# Patient Record
Sex: Male | Born: 1990 | Race: Black or African American | Hispanic: No | Marital: Single | Smoking: Never smoker
Health system: Southern US, Community
[De-identification: ages and names within clinical notes are randomized; demographics above are authoritative.]

## PROBLEM LIST (undated history)

## (undated) ENCOUNTER — Emergency Department (HOSPITAL_COMMUNITY): Payer: Self-pay | Source: Home / Self Care

## (undated) DIAGNOSIS — A549 Gonococcal infection, unspecified: Secondary | ICD-10-CM

## (undated) DIAGNOSIS — W3400XA Accidental discharge from unspecified firearms or gun, initial encounter: Secondary | ICD-10-CM

---
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
## 2009-07-23 ENCOUNTER — Emergency Department (HOSPITAL_BASED_OUTPATIENT_CLINIC_OR_DEPARTMENT_OTHER): Admission: EM | Admit: 2009-07-23 | Discharge: 2009-07-23 | Payer: Self-pay | Admitting: Emergency Medicine

## 2009-08-11 ENCOUNTER — Emergency Department (HOSPITAL_BASED_OUTPATIENT_CLINIC_OR_DEPARTMENT_OTHER): Admission: EM | Admit: 2009-08-11 | Discharge: 2009-08-11 | Payer: Self-pay | Admitting: Rheumatology

## 2009-08-11 ENCOUNTER — Ambulatory Visit: Payer: Self-pay | Admitting: Diagnostic Radiology

## 2010-03-19 LAB — LIPASE, BLOOD: Lipase: 46 U/L (ref 23–300)

## 2010-03-19 LAB — CBC
HCT: 40.9 % (ref 39.0–52.0)
Hemoglobin: 13.7 g/dL (ref 13.0–17.0)
MCH: 30.7 pg (ref 26.0–34.0)
MCHC: 33.4 g/dL (ref 30.0–36.0)
RBC: 4.45 MIL/uL (ref 4.22–5.81)

## 2010-03-19 LAB — COMPREHENSIVE METABOLIC PANEL
ALT: 19 U/L (ref 0–53)
AST: 21 U/L (ref 0–37)
CO2: 29 mEq/L (ref 19–32)
Chloride: 104 mEq/L (ref 96–112)
Creatinine, Ser: 1.3 mg/dL (ref 0.4–1.5)
GFR calc Af Amer: >60 mL/min (ref >60)
GFR calc non Af Amer: >60 mL/min (ref >60)
Glucose, Bld: 82 mg/dL (ref 70–99)
Sodium: 145 mEq/L (ref 135–145)
Total Bilirubin: 1 mg/dL (ref 0.3–1.2)

## 2010-03-19 LAB — URINALYSIS, ROUTINE W REFLEX MICROSCOPIC
Ketones, ur: 15 mg/dL — AB
Nitrite: NEGATIVE
Protein, ur: 30 mg/dL — AB
pH: 6.5 (ref 5.0–8.0)

## 2010-03-19 LAB — DIFFERENTIAL
Basophils Absolute: 0 10*3/uL (ref 0.0–0.1)
Eosinophils Absolute: 0.2 10*3/uL (ref 0.0–0.7)
Eosinophils Relative: 6 % — ABNORMAL HIGH (ref 0–5)
Neutrophils Relative %: 49 % (ref 43–77)

## 2010-03-19 LAB — URINE MICROSCOPIC-ADD ON

## 2016-02-05 ENCOUNTER — Emergency Department (HOSPITAL_BASED_OUTPATIENT_CLINIC_OR_DEPARTMENT_OTHER)
Admission: EM | Admit: 2016-02-05 | Discharge: 2016-02-05 | Disposition: A | Discharge status: Home / Self Care | Payer: Self-pay | Attending: Emergency Medicine | Admitting: Emergency Medicine

## 2016-02-05 ENCOUNTER — Encounter (HOSPITAL_BASED_OUTPATIENT_CLINIC_OR_DEPARTMENT_OTHER): Payer: Self-pay | Admitting: *Deleted

## 2016-02-05 DIAGNOSIS — Z202 Contact with and (suspected) exposure to infections with a predominantly sexual mode of transmission: Secondary | ICD-10-CM | POA: Insufficient documentation

## 2016-02-05 DIAGNOSIS — F17200 Nicotine dependence, unspecified, uncomplicated: Secondary | ICD-10-CM | POA: Insufficient documentation

## 2016-02-05 MED ORDER — CEFTRIAXONE SODIUM 250 MG IJ SOLR
250.0000 mg | Freq: Once | INTRAMUSCULAR | Status: AC
  Administered 2016-02-05: 250 mg via INTRAMUSCULAR
  Filled 2016-02-05: qty 250

## 2016-02-05 MED ORDER — AZITHROMYCIN 250 MG PO TABS
1000.0000 mg | ORAL_TABLET | Freq: Once | ORAL | Status: AC
  Administered 2016-02-05: 1000 mg via ORAL
  Filled 2016-02-05: qty 4

## 2016-02-05 NOTE — ED Triage Notes (Signed)
Patient states he a two day history of penile drainage.  States his girlfriend was recently treated for gonorrhea.

## 2016-02-05 NOTE — ED Provider Notes (Signed)
[facility]   CSN: [id number] Arrival date & time: 02/05/16  1010     History   Chief Complaint Chief Complaint  Patient presents with  . Penile Discharge    HPI Nathan West is a 26 y.o. male.  Patient is a 26 year old male with no significant past medical history. He presents for evaluation of urethral discharge. He reports his fiance was diagnosed with gonorrhea. He denies any fevers or chills. He denies abdominal pain.   The history is provided by the patient.  Penile Discharge  This is a new problem. The current episode started 2 days ago. The problem occurs constantly. The problem has been rapidly worsening. Exacerbated by: Urinating. Nothing relieves the symptoms. He has tried nothing for the symptoms.    History reviewed. No pertinent past medical history.  There are no active problems to display for this patient.   History reviewed. No pertinent surgical history.     Home Medications    Prior to Admission medications   Not on File    Family History No family history on file.  Social History Social History  Substance Use Topics  . Smoking status: Current Some Day Smoker  . Smokeless tobacco: Never Used  . Alcohol use Yes     Comment: occassionally     Allergies   Patient has no known allergies.   Review of Systems Review of Systems  Genitourinary: Positive for discharge.  All other systems reviewed and are negative.    Physical Exam Updated Vital Signs BP 113/78 (BP Location: Left Arm)   Pulse 70   Temp 98.1 F (36.7 C) (Oral)   Resp 18   Ht 5\' 10"  (1.778 m)   Wt 189 lb 11.2 oz (86 kg)   SpO2 98%   BMI 27.22 kg/m   Physical Exam  Constitutional: He is oriented to person, place, and time. He appears well-developed and well-nourished. No distress.  HENT:  Head: Normocephalic and atraumatic.  Neck: Normal range of motion. Neck supple.  Genitourinary: Penis normal.  Genitourinary Comments: There is a  slight urethral discharge noted. There are no other rashes or lesions and external genitalia is otherwise unremarkable.   Neurological: He is alert and oriented to person, place, and time.  Skin: Skin is warm and dry. He is not diaphoretic.  Nursing note and vitals reviewed.    ED Treatments / Results  Labs (all labs ordered are listed, but only abnormal results are displayed) Labs Reviewed  GC/CHLAMYDIA PROBE AMP ([place]) NOT AT [facility]    EKG  EKG Interpretation None       Radiology No results found.  Procedures Procedures (including critical care time)  Medications Ordered in ED Medications  cefTRIAXone (ROCEPHIN) injection 250 mg (not administered)  azithromycin (ZITHROMAX) tablet 1,000 mg (not administered)     Initial Impression / Assessment and Plan / ED Course  I have reviewed the triage vital signs and the nursing notes.  Pertinent labs & imaging results that were available during my care of the patient were reviewed by me and considered in my medical decision making (see chart for details).  GC and Chlamydia swab was taken and he will be treated with Rocephin and Zithromax. He was advised to abstain from unprotected sex for the next 2 weeks and notify his sexual partners so that they can also be treated.   Final Clinical Impressions(s) / ED Diagnoses   Final diagnoses:  None    New Prescriptions New  Prescriptions   No medications on file     Geoffery Lyons, MD 02/05/16 1030

## 2016-02-05 NOTE — ED Notes (Signed)
ED Provider at bedside. 

## 2016-02-05 NOTE — Discharge Instructions (Signed)
Abstain from unprotected sexual activity for the next 2 weeks.  We will call you if your cultures indicate you require further treatment or action.

## 2016-02-05 NOTE — ED Notes (Signed)
Pt given juice and crackers at this time

## 2016-02-07 LAB — GC/CHLAMYDIA PROBE AMP (~~LOC~~) NOT AT ARMC
CHLAMYDIA, DNA PROBE: NEGATIVE
NEISSERIA GONORRHEA: POSITIVE — AB

## 2016-03-04 ENCOUNTER — Emergency Department (HOSPITAL_BASED_OUTPATIENT_CLINIC_OR_DEPARTMENT_OTHER): Payer: Self-pay

## 2016-03-04 ENCOUNTER — Emergency Department (HOSPITAL_BASED_OUTPATIENT_CLINIC_OR_DEPARTMENT_OTHER)
Admission: EM | Admit: 2016-03-04 | Discharge: 2016-03-04 | Disposition: A | Discharge status: Home / Self Care | Payer: Self-pay | Attending: Physician Assistant | Admitting: Physician Assistant

## 2016-03-04 ENCOUNTER — Encounter (HOSPITAL_BASED_OUTPATIENT_CLINIC_OR_DEPARTMENT_OTHER): Payer: Self-pay | Admitting: Emergency Medicine

## 2016-03-04 DIAGNOSIS — Z87891 Personal history of nicotine dependence: Secondary | ICD-10-CM | POA: Insufficient documentation

## 2016-03-04 DIAGNOSIS — K3184 Gastroparesis: Secondary | ICD-10-CM | POA: Insufficient documentation

## 2016-03-04 LAB — COMPREHENSIVE METABOLIC PANEL
ALT: 24 U/L (ref 17–63)
AST: 29 U/L (ref 15–41)
Albumin: 4.3 g/dL (ref 3.5–5.0)
Alkaline Phosphatase: 72 U/L (ref 38–126)
Anion gap: 10 (ref 5–15)
BUN: 7 mg/dL (ref 6–20)
CHLORIDE: 108 mmol/L (ref 101–111)
CO2: 26 mmol/L (ref 22–32)
CREATININE: 1.03 mg/dL (ref 0.61–1.24)
Calcium: 8.9 mg/dL (ref 8.9–10.3)
GFR calc Af Amer: >60 mL/min (ref >60)
GFR calc non Af Amer: >60 mL/min (ref >60)
Glucose, Bld: 144 mg/dL — ABNORMAL HIGH (ref 65–99)
POTASSIUM: 3.6 mmol/L (ref 3.5–5.1)
SODIUM: 144 mmol/L (ref 135–145)
Total Bilirubin: 0.4 mg/dL (ref 0.3–1.2)
Total Protein: 8.1 g/dL (ref 6.5–8.1)

## 2016-03-04 LAB — CBC
HCT: 41.7 % (ref 39.0–52.0)
Hemoglobin: 13.9 g/dL (ref 13.0–17.0)
MCH: 30 pg (ref 26.0–34.0)
MCHC: 33.3 g/dL (ref 30.0–36.0)
MCV: 89.9 fL (ref 78.0–100.0)
Platelets: 168 10*3/uL (ref 150–400)
RBC: 4.64 MIL/uL (ref 4.22–5.81)
RDW: 13.1 % (ref 11.5–15.5)
WBC: 8.1 10*3/uL (ref 4.0–10.5)

## 2016-03-04 LAB — URINALYSIS, ROUTINE W REFLEX MICROSCOPIC
Bilirubin Urine: NEGATIVE
GLUCOSE, UA: NEGATIVE mg/dL
HGB URINE DIPSTICK: NEGATIVE
Ketones, ur: NEGATIVE mg/dL
Leukocytes, UA: NEGATIVE
Nitrite: NEGATIVE
PH: 8 (ref 5.0–8.0)
PROTEIN: NEGATIVE mg/dL
Specific Gravity, Urine: 1.041 — ABNORMAL HIGH (ref 1.005–1.030)

## 2016-03-04 LAB — LIPASE, BLOOD: Lipase: 21 U/L (ref 11–51)

## 2016-03-04 MED ORDER — OMEPRAZOLE 20 MG PO CPDR: 20.0000 mg | DELAYED_RELEASE_CAPSULE | Freq: Every day | ORAL | 0 refills | Status: DC

## 2016-03-04 MED ORDER — IOPAMIDOL (ISOVUE-300) INJECTION 61%
100.0000 mL | Freq: Once | INTRAVENOUS | Status: AC | PRN
  Administered 2016-03-04: 100 mL via INTRAVENOUS

## 2016-03-04 MED ORDER — FENTANYL CITRATE (PF) 100 MCG/2ML IJ SOLN
50.0000 ug | INTRAMUSCULAR | Status: DC | PRN
Start: 2016-03-04 — End: 2016-03-04
  Administered 2016-03-04: 50 ug via INTRAVENOUS
  Filled 2016-03-04: qty 2

## 2016-03-04 MED ORDER — SODIUM CHLORIDE 0.9 % IV BOLUS (SEPSIS)
1000.0000 mL | Freq: Once | INTRAVENOUS | Status: AC
  Administered 2016-03-04: 1000 mL via INTRAVENOUS

## 2016-03-04 MED ORDER — ONDANSETRON HCL 4 MG/2ML IJ SOLN
4.0000 mg | Freq: Once | INTRAMUSCULAR | Status: AC
Start: 2016-03-04 — End: 2016-03-04
  Administered 2016-03-04: 4 mg via INTRAVENOUS
  Filled 2016-03-04: qty 2

## 2016-03-04 MED ORDER — ONDANSETRON 4 MG PO TBDP: 4.0000 mg | ORAL_TABLET | Freq: Three times a day (TID) | ORAL | 0 refills | Status: DC | PRN

## 2016-03-04 MED ORDER — ONDANSETRON HCL 4 MG/2ML IJ SOLN
4.0000 mg | Freq: Once | INTRAMUSCULAR | Status: AC
  Administered 2016-03-04: 4 mg via INTRAVENOUS
  Filled 2016-03-04: qty 2

## 2016-03-04 NOTE — ED Notes (Addendum)
Upon arrival, Pt went down on his knees directly from vehicle. Vomiting and yelling in pain. Pt stood and sat in wheelchair and taken to triage.

## 2016-03-04 NOTE — ED Notes (Signed)
ED Provider at bedside. 

## 2016-03-04 NOTE — ED Notes (Signed)
Pt reminded of the need for urine sample

## 2016-03-04 NOTE — ED Provider Notes (Signed)
[facility]   CSN: [id number] Arrival date & time: 03/04/16  1151     History   Chief Complaint Chief Complaint  Patient presents with  . Abdominal Pain    HPI Nathan West is a 26 y.o. male.  The history is provided by the patient.  Abdominal Pain   This is a new problem. The current episode started 3 to 5 hours ago. The problem occurs constantly. The problem has not changed since onset.The pain is associated with eating and alcohol use. The pain is located in the generalized abdominal region. The quality of the pain is aching and dull. The pain is at a severity of 2/10. The pain is moderate. Associated symptoms include anorexia, diarrhea and vomiting. The symptoms are aggravated by palpation. Nothing relieves the symptoms. His past medical history does not include PUD, gallstones, GERD, ulcerative colitis, Crohn's disease or irritable bowel syndrome.    History reviewed. No pertinent past medical history.  There are no active problems to display for this patient.   History reviewed. No pertinent surgical history.     Home Medications    Prior to Admission medications   Medication Sig Start Date End Date Taking? Authorizing Provider  omeprazole (PRILOSEC) 20 MG capsule Take 1 capsule (20 mg total) by mouth daily. 03/04/16   Quinton Voth Lyn Odaliz Mcqueary, MD  ondansetron (ZOFRAN ODT) 4 MG disintegrating tablet Take 1 tablet (4 mg total) by mouth every 8 (eight) hours as needed for nausea or vomiting. 03/04/16   Angeliki Mates Lyn Solangel Mcmanaway, MD    Family History No family history on file.  Social History Social History  Substance Use Topics  . Smoking status: Former Games developermoker  . Smokeless tobacco: Never Used  . Alcohol use Yes     Comment: occassionally     Allergies   Patient has no known allergies.   Review of Systems Review of Systems  Gastrointestinal: Positive for abdominal pain, anorexia, diarrhea and vomiting.     Physical Exam Updated  Vital Signs BP 129/68 (BP Location: Left Arm)   Pulse (!) 58   Temp 98.7 F (37.1 C) (Oral)   Resp 20   Ht 5\' 10"  (1.778 m)   Wt 189 lb (85.7 kg)   SpO2 100%   BMI 27.12 kg/m   Physical Exam  Constitutional: He is oriented to person, place, and time. He appears well-nourished.  HENT:  Head: Normocephalic.  Eyes: Conjunctivae are normal.  Cardiovascular: Normal rate and regular rhythm.   No murmur heard. Pulmonary/Chest: Effort normal and breath sounds normal. No respiratory distress.  Abdominal: Bowel sounds are normal. He exhibits no distension.  Diffuse mild tenderness  Neurological: He is oriented to person, place, and time.  Skin: Skin is warm and dry. He is not diaphoretic.  Psychiatric: He has a normal mood and affect. His behavior is normal.     ED Treatments / Results  Labs (all labs ordered are listed, but only abnormal results are displayed) Labs Reviewed  COMPREHENSIVE METABOLIC PANEL - Abnormal; Notable for the following:       Result Value   Glucose, Bld 144 (*)    All other components within normal limits  URINALYSIS, ROUTINE W REFLEX MICROSCOPIC - Abnormal; Notable for the following:    Specific Gravity, Urine 1.041 (*)    All other components within normal limits  LIPASE, BLOOD  CBC    EKG  EKG Interpretation None       Radiology Ct Abdomen Pelvis W  Contrast  Result Date: 03/04/2016 CLINICAL DATA:  Nausea and vomiting with abdominal pain today. EXAM: CT ABDOMEN AND PELVIS WITH CONTRAST TECHNIQUE: Multidetector CT imaging of the abdomen and pelvis was performed using the standard protocol following bolus administration of intravenous contrast. CONTRAST:  ISOVUE-300 IOPAMIDOL (ISOVUE-300) INJECTION 61% COMPARISON:  None. FINDINGS: Lower chest: Subtle contrast level in the lower esophagus on image 4/series 2. Clear lung bases. Normal heart size without pericardial or pleural effusion. Hepatobiliary: Normal liver. Normal gallbladder, without  biliary ductal dilatation. Pancreas: Normal, without mass or ductal dilatation. Spleen: Normal in size, without focal abnormality. Adrenals/Urinary Tract: Normal adrenal glands. Normal kidneys, without hydronephrosis. Normal urinary bladder. Stomach/Bowel: Normal stomach, without wall thickening. The colon is relatively diffusely underdistended. No focal abnormality identified. Normal appendix, including on coronal image 29 and transverse image 65. Normal small bowel. Vascular/Lymphatic: Normal caliber of the aorta and branch vessels. No abdominopelvic adenopathy. Reproductive: Normal prostate. Other: No significant free fluid.  No free intraperitoneal air. Musculoskeletal: No acute osseous abnormality. IMPRESSION: 1.  No acute process in the abdomen or pelvis. 2. Esophageal air fluid level suggests dysmotility or gastroesophageal reflux. Electronically Signed   By: Jeronimo Greaves M.D.   On: 03/04/2016 14:30    Procedures Procedures (including critical care time)  Medications Ordered in ED Medications  ondansetron (ZOFRAN) injection 4 mg (4 mg Intravenous Given 03/04/16 1220)  sodium chloride 0.9 % bolus 1,000 mL (0 mLs Intravenous Stopped 03/04/16 1508)  iopamidol (ISOVUE-300) 61 % injection 100 mL (100 mLs Intravenous Contrast Given 03/04/16 1345)  ondansetron (ZOFRAN) injection 4 mg (4 mg Intravenous Given 03/04/16 1517)  sodium chloride 0.9 % bolus 1,000 mL (0 mLs Intravenous Stopped 03/04/16 1609)     Initial Impression / Assessment and Plan / ED Course  I have reviewed the triage vital signs and the nursing notes.  Pertinent labs & imaging results that were available during my care of the patient were reviewed by me and considered in my medical decision making (see chart for details).   Pt is well appearing 26 yo presenting with diffuse abominal pain, nausea vomiting and diarrhea. Given pain on exam will get CT though I think viral gastroenteritis most likely. Will give fluids and symptomatic care.     Patient received fluids IV, CT with evidence of gastroparesis vs GERD. Discussed cannibus hyperemesis with patient. He has been taking hot showers, and has had this repeatedly.  Discussed cutting down/stopping marijuana use.  Patient able to take PO, normalized vitals nd feeling improved before discharge.    Final Clinical Impressions(s) / ED Diagnoses   Final diagnoses:  Gastroparesis    New Prescriptions Discharge Medication List as of 03/04/2016  4:03 PM    START taking these medications   Details  omeprazole (PRILOSEC) 20 MG capsule Take 1 capsule (20 mg total) by mouth daily., Starting Sat 03/04/2016, Print    ondansetron (ZOFRAN ODT) 4 MG disintegrating tablet Take 1 tablet (4 mg total) by mouth every 8 (eight) hours as needed for nausea or vomiting., Starting Sat 03/04/2016, Print         Sylvanna Burggraf Randall An, MD 03/05/16 0745

## 2016-03-04 NOTE — ED Notes (Signed)
Pt arrived pov, as car stopped pt opened his door and crawled to ground face down yelling and had small amount of emesis, pt instructed to get up and get into wheel chair in which pt complied. Pt pushed to registration. Pt given emesis bag. Pt proceeded to yell and put feet on registration desk and attempted to rock  wheelchair over backwards. Pt instructed to not to purposely push wheelchair over. Pt pushed to triage. While getting triage ready for pt, pt crawled onto floor out of wheel chair. Pt instructed to get back onto chair. Pt requested to lie flat. triage chair layed flat so pt could lay down. Pt would not answer height and weight at first. During triage assessment pt grabbed trash can and stuck finger in mouth in attempt to vomit. Pt told to stop and given a second emesis bag. Pt crawled to floor. Pt instructed to get back onto triage stretcher. Pt stuck finger in mouth a second time to vomit.

## 2016-03-04 NOTE — ED Triage Notes (Signed)
N/V and diarrhea today with abd pain. Instructed not to stick his finger down his throat in triage to try to vomit. Restless, anxious . Vomiting in waiting room

## 2016-03-04 NOTE — ED Notes (Signed)
Pt tried to drink something and began vomiting again, EDP made aware.

## 2016-03-04 NOTE — Discharge Instructions (Signed)
U had a lot of vomiting today but had a normal CT. It could be that his gastroparesis from your marijuana use. Please refrain from marijuana to see if this helps make it better. We've given used some nausea medication to take home with you.  Please return if you are unable to stay hydrated or have any concerns.

## 2016-04-06 ENCOUNTER — Emergency Department (HOSPITAL_BASED_OUTPATIENT_CLINIC_OR_DEPARTMENT_OTHER)
Admission: EM | Admit: 2016-04-06 | Discharge: 2016-04-06 | Disposition: A | Discharge status: Home / Self Care | Payer: Self-pay | Attending: Physician Assistant | Admitting: Physician Assistant

## 2016-04-06 ENCOUNTER — Encounter (HOSPITAL_BASED_OUTPATIENT_CLINIC_OR_DEPARTMENT_OTHER): Payer: Self-pay | Admitting: *Deleted

## 2016-04-06 DIAGNOSIS — R1010 Upper abdominal pain, unspecified: Secondary | ICD-10-CM | POA: Insufficient documentation

## 2016-04-06 DIAGNOSIS — Z87891 Personal history of nicotine dependence: Secondary | ICD-10-CM | POA: Insufficient documentation

## 2016-04-06 DIAGNOSIS — R112 Nausea with vomiting, unspecified: Secondary | ICD-10-CM | POA: Insufficient documentation

## 2016-04-06 DIAGNOSIS — F1210 Cannabis abuse, uncomplicated: Secondary | ICD-10-CM | POA: Insufficient documentation

## 2016-04-06 LAB — URINALYSIS, ROUTINE W REFLEX MICROSCOPIC
Bilirubin Urine: NEGATIVE
GLUCOSE, UA: NEGATIVE mg/dL
HGB URINE DIPSTICK: NEGATIVE
KETONES UR: 15 mg/dL — AB
Leukocytes, UA: NEGATIVE
Nitrite: NEGATIVE
PROTEIN: 30 mg/dL — AB
Specific Gravity, Urine: 1.036 — ABNORMAL HIGH (ref 1.005–1.030)
pH: 7 (ref 5.0–8.0)

## 2016-04-06 LAB — URINALYSIS, MICROSCOPIC (REFLEX): Squamous Epithelial / LPF: NONE SEEN

## 2016-04-06 MED ORDER — ONDANSETRON 4 MG PO TBDP: 4.0000 mg | ORAL_TABLET | Freq: Three times a day (TID) | ORAL | 0 refills | Status: DC | PRN

## 2016-04-06 MED ORDER — SODIUM CHLORIDE 0.9 % IV BOLUS (SEPSIS)
1000.0000 mL | Freq: Once | INTRAVENOUS | Status: AC
  Administered 2016-04-06: 1000 mL via INTRAVENOUS

## 2016-04-06 MED ORDER — ONDANSETRON HCL 4 MG/2ML IJ SOLN
4.0000 mg | Freq: Once | INTRAMUSCULAR | Status: AC
  Administered 2016-04-06: 4 mg via INTRAVENOUS
  Filled 2016-04-06: qty 2

## 2016-04-06 MED FILL — ONDANSETRON ODT 4 MG TABLET: 4 | 6 days supply | Qty: 20 | Fill #0

## 2016-04-06 NOTE — Discharge Instructions (Signed)
Given your recent ED visit last month, negative blood work, negative CT scan and similar symptoms today I suspect that your nausea and vomiting is likely from your long term use of marijuana. This is called "cannabinoid hyperemesis syndrome" and it is defined as chronic marijuana use with cyclic episodes of nausea and vomiting, frequent hot bathing usually relieves the symptoms.    I encourage you to cut back on your marijuana use. See attached resources.  Take zofran for nausea.

## 2016-04-06 NOTE — ED Triage Notes (Addendum)
Abdominal pain x 4 days with vomiting. He was seen here for the same a month ago. States he never go his Rx's filled but plans to go get the medications when he leaves here today. No active vomiting at triage. He is able to look at pictures on a cell phone with no difficulty.

## 2016-04-06 NOTE — ED Provider Notes (Signed)
[facility]   CSN: [id number] Arrival date & time: 04/06/16  1033     History   Chief Complaint Chief Complaint  Patient presents with  . Emesis    HPI Nathan West is a 26 y.o. male with pertinent pmh of marijuana use presents to ED with upper abdominal achy/dull pain, nausea and vomiting x 4 days.  Patient notes that his vomiting has slowed down, he is mostly dry heaving now.  Patient states he feels dehydrated. No fever, headache, changes in vision, diarrhea, constipation, bloody stools, CP, shortness of breath, rashes, neck rigidity/pain. No exposure to suspicious foods. No new medications. No recent travel.  No known sick contacts with similar symptoms. Patient states he was seen here for similar symptoms last month, he notes they did a CT scan and blood work, they told him his n/v/abdominal pain could be related to his marijuana and alcohol use. Patient denies recent ETOH use however states he smokes "a lot of weed", daily.  Patient reports hot bathing alleviates his nausea slightly. Last use of marijuana last night. No h/o PUD, GERD, gastritis, DM.   HPI  History reviewed. No pertinent past medical history.  There are no active problems to display for this patient.   History reviewed. No pertinent surgical history.     Home Medications    Prior to Admission medications   Medication Sig Start Date End Date Taking? Authorizing Provider  omeprazole (PRILOSEC) 20 MG capsule Take 1 capsule (20 mg total) by mouth daily. 03/04/16   Courteney Lyn Mackuen, MD  ondansetron (ZOFRAN ODT) 4 MG disintegrating tablet Take 1 tablet (4 mg total) by mouth every 8 (eight) hours as needed for nausea or vomiting. 04/06/16   Liberty Handy, PA-C    Family History No family history on file.  Social History Social History  Substance Use Topics  . Smoking status: Former Games developer  . Smokeless tobacco: Never Used  . Alcohol use Yes     Comment: occassionally      Allergies   Patient has no known allergies.   Review of Systems Review of Systems  Constitutional: Negative for chills and fever.  HENT: Negative for congestion.   Respiratory: Negative for cough and shortness of breath.   Cardiovascular: Negative for chest pain.  Gastrointestinal: Positive for abdominal pain, nausea and vomiting. Negative for blood in stool, constipation and diarrhea.  Genitourinary: Negative for difficulty urinating and dysuria.  Musculoskeletal: Negative for arthralgias, neck pain and neck stiffness.  Skin: Negative for rash.  Neurological: Negative for dizziness and headaches.     Physical Exam Updated Vital Signs BP (!) 103/51 (BP Location: Right Arm)   Pulse 64   Temp 98.3 F (36.8 C) (Oral)   Resp 18   Ht  (1.778 m)   Wt 86.2 kg   SpO2 100%   BMI 27.26 kg/m   Physical Exam  Constitutional: He is oriented to person, place, and time. He appears well-developed and well-nourished. No distress.  HENT:  Head: Normocephalic and atraumatic.  Nose: Nose normal.  Mouth/Throat: Oropharynx is clear and moist. No oropharyngeal exudate.  Eyes: Conjunctivae and EOM are normal. Pupils are equal, round, and reactive to light.  Neck: Normal range of motion. Neck supple.  Cardiovascular: Normal rate, regular rhythm, normal heart sounds and intact distal pulses.   No murmur heard. Pulmonary/Chest: Effort normal and breath sounds normal. No respiratory distress. He has no wheezes. He has no rales.  Abdominal: There is  tenderness.  No surgical abdominal scars noted.  No pulsating masses.  + Bowel sounds throughout.  Mild TTP at upper abdominal region otherwise soft without distention, rigidity, guarding or rebound.  No suprapubic tenderness. No CVAT.  Negative Murphy's. Negative McBurney's. Negative Psoas sign.  Non palpable kidneys. No hepatosplenomegaly.   Musculoskeletal: Normal range of motion. He exhibits no deformity.  Lymphadenopathy:    He  has no cervical adenopathy.  Neurological: He is alert and oriented to person, place, and time.  Skin: Skin is warm and dry. Capillary refill takes less than 2 seconds.  Psychiatric: He has a normal mood and affect. His behavior is normal. Judgment and thought content normal.  Nursing note and vitals reviewed.    ED Treatments / Results  Labs (all labs ordered are listed, but only abnormal results are displayed) Labs Reviewed  URINALYSIS, ROUTINE W REFLEX MICROSCOPIC - Abnormal; Notable for the following:       Result Value   Color, Urine AMBER (*)    Specific Gravity, Urine 1.036 (*)    Ketones, ur 15 (*)    Protein, ur 30 (*)    All other components within normal limits  URINALYSIS, MICROSCOPIC (REFLEX) - Abnormal; Notable for the following:    Bacteria, UA RARE (*)    All other components within normal limits    EKG  EKG Interpretation None       Radiology No results found.  Procedures Procedures (including critical care time)  Medications Ordered in ED Medications  sodium chloride 0.9 % bolus 1,000 mL (0 mLs Intravenous Stopped 04/06/16 1310)  ondansetron (ZOFRAN) injection 4 mg (4 mg Intravenous Given 04/06/16 1245)     Initial Impression / Assessment and Plan / ED Course  I have reviewed the triage vital signs and the nursing notes.  Pertinent labs & imaging results that were available during my care of the patient were reviewed by me and considered in my medical decision making (see chart for details).    26 yo male with nausea, vomiting, diffuse abdominal pain.  Relieved by hot bathing. Patient seen in ED for same one month ago with normal blood work and CT scan. Patient openly discloses that he is a chronic marijuana use, "a lot", "everyday".  Patient does report he has been advised to cut back on marijuana use as this could be contributing.  Patient not ready to discuss cessation, but strongly encouraged him to consider it.  Highly suspecting cannabis  hyperemesis.  Less likely gastroenteritis.  Patient given IVF and zofran.  Vital signs within normal limits and stable while in ED. No episodes of vomiting in ED. On re-evaluation patient was laying in bed with his partner playing on ipad. Repeat abdominal exam reassuring, non tender and soft abdomen. Patient tolerated PO in ED withotu difficulty. atient ws prescribed GERD medications at last ED visit, has not refilled those medications.  Advised patient he should refill and take these medications, he expressed understanding.  Patient considered safe for discharge at this time. All questions and concerns addressed.   Patient, ED treatment and discharge plan was discussed with supervising physician who is agreeable with plan. SP was the ED provider who evaluated patient last month in ED.   Final Clinical Impressions(s) / ED Diagnoses   Final diagnoses:  Nausea and vomiting in adult  Cannabis abuse    New Prescriptions Discharge Medication List as of 04/06/2016  2:19 PM       Liberty Handy, PA-C 04/06/16 1514  Courteney Randall An, MD 04/07/16 [phone]

## 2016-04-06 NOTE — ED Notes (Signed)
amb to BR 

## 2016-04-06 NOTE — Discharge Planning (Signed)
Pt up for discharge. [facility] reviewed chart for possible CM needs.  No needs identified or communicated. 3

## 2016-12-06 ENCOUNTER — Emergency Department (HOSPITAL_BASED_OUTPATIENT_CLINIC_OR_DEPARTMENT_OTHER)
Admission: EM | Admit: 2016-12-06 | Discharge: 2016-12-06 | Disposition: A | Discharge status: Home / Self Care | Payer: Self-pay | Attending: Emergency Medicine | Admitting: Emergency Medicine

## 2016-12-06 ENCOUNTER — Other Ambulatory Visit: Payer: Self-pay

## 2016-12-06 ENCOUNTER — Encounter (HOSPITAL_BASED_OUTPATIENT_CLINIC_OR_DEPARTMENT_OTHER): Payer: Self-pay

## 2016-12-06 DIAGNOSIS — N342 Other urethritis: Secondary | ICD-10-CM | POA: Insufficient documentation

## 2016-12-06 DIAGNOSIS — Z87891 Personal history of nicotine dependence: Secondary | ICD-10-CM | POA: Insufficient documentation

## 2016-12-06 LAB — URINALYSIS, MICROSCOPIC (REFLEX)

## 2016-12-06 LAB — URINALYSIS, ROUTINE W REFLEX MICROSCOPIC
Bilirubin Urine: NEGATIVE
GLUCOSE, UA: NEGATIVE mg/dL
Hgb urine dipstick: NEGATIVE
KETONES UR: NEGATIVE mg/dL
NITRITE: NEGATIVE
PROTEIN: NEGATIVE mg/dL
Specific Gravity, Urine: 1.01 (ref 1.005–1.030)
pH: 6 (ref 5.0–8.0)

## 2016-12-06 MED ORDER — METRONIDAZOLE 500 MG PO TABS: 500.0000 mg | ORAL_TABLET | Freq: Two times a day (BID) | ORAL | 0 refills | Status: DC

## 2016-12-06 MED ORDER — CEFTRIAXONE SODIUM 250 MG IJ SOLR
250.0000 mg | Freq: Once | INTRAMUSCULAR | Status: AC
  Administered 2016-12-06: 250 mg via INTRAMUSCULAR
  Filled 2016-12-06: qty 250

## 2016-12-06 MED ORDER — AZITHROMYCIN 250 MG PO TABS
1000.0000 mg | ORAL_TABLET | Freq: Once | ORAL | Status: AC
  Administered 2016-12-06: 1000 mg via ORAL
  Filled 2016-12-06: qty 4

## 2016-12-06 NOTE — ED Triage Notes (Signed)
C/o STD exposure and penile dc x 2 days-NAD-steady gait

## 2016-12-06 NOTE — ED Provider Notes (Signed)
[facility] EMERGENCY DEPARTMENT Provider Note   CSN: [id number] Arrival date & time: 12/06/16  1224     History   Chief Complaint Chief Complaint  Patient presents with  . Penile Discharge    HPI Nathan West is a 26 y.o. male.  HPI Patient presents with 2 days of yellow pain of discharge and dysuria.  States he had a sexual partner that was recently diagnosed with gonorrhea and trichomonas.  Denies any abdominal pain, nausea or vomiting.  No new rashes or lesions. History reviewed. No pertinent past medical history.  There are no active problems to display for this patient.   History reviewed. No pertinent surgical history.     Home Medications    Prior to Admission medications   Medication Sig Start Date End Date Taking? Authorizing Provider  metroNIDAZOLE (FLAGYL) 500 MG tablet Take 1 tablet (500 mg total) by mouth 2 (two) times daily. One po bid x 7 days 12/06/16   Loren RacerYelverton, Verl Kitson, MD    Family History No family history on file.  Social History Social History   Tobacco Use  . Smoking status: Former Games developermoker  . Smokeless tobacco: Never Used  Substance Use Topics  . Alcohol use: Yes    Comment: occassionally  . Drug use: Yes    Types: Marijuana     Allergies   Patient has no known allergies.   Review of Systems Review of Systems  Constitutional: Negative for chills and fever.  Respiratory: Negative for cough and shortness of breath.   Cardiovascular: Negative for chest pain.  Gastrointestinal: Negative for abdominal pain, diarrhea, nausea and vomiting.  Genitourinary: Positive for discharge and dysuria. Negative for hematuria, penile swelling, scrotal swelling and testicular pain.  Musculoskeletal: Negative for back pain, myalgias, neck pain and neck stiffness.  Skin: Negative for rash and wound.  Neurological: Negative for dizziness, weakness, light-headedness, numbness and headaches.  All other systems reviewed and are  negative.    Physical Exam Updated Vital Signs BP 102/62 (BP Location: Left Arm)   Pulse 64   Temp 97.9 F (36.6 C) (Oral)   Resp 18   Ht 5\' 9"  (1.753 m)   Wt 85.3 kg (188 lb)   SpO2 100%   BMI 27.76 kg/m   Physical Exam  Constitutional: He is oriented to person, place, and time. He appears well-developed and well-nourished. No distress.  HENT:  Head: Normocephalic and atraumatic.  Eyes: EOM are normal. Pupils are equal, round, and reactive to light.  Neck: Normal range of motion. Neck supple.  Cardiovascular: Normal rate and regular rhythm.  Pulmonary/Chest: Effort normal and breath sounds normal.  Abdominal: Soft. Bowel sounds are normal. There is no tenderness. There is no rebound and no guarding.  Genitourinary:  Genitourinary Comments: Patient defers genital exam.  Musculoskeletal: Normal range of motion. He exhibits no edema or tenderness.  Neurological: He is alert and oriented to person, place, and time.  Skin: Skin is warm and dry. No rash noted. No erythema.  Psychiatric: He has a normal mood and affect. His behavior is normal.  Nursing note and vitals reviewed.    ED Treatments / Results  Labs (all labs ordered are listed, but only abnormal results are displayed) Labs Reviewed  URINALYSIS, ROUTINE W REFLEX MICROSCOPIC - Abnormal; Notable for the following components:      Result Value   Leukocytes, UA TRACE (*)    All other components within normal limits  URINALYSIS, MICROSCOPIC (REFLEX) - Abnormal; Notable for the  following components:   Bacteria, UA RARE (*)    Squamous Epithelial / LPF 0-5 (*)    All other components within normal limits  GC/CHLAMYDIA PROBE AMP ([place]) NOT AT [facility]    EKG  EKG Interpretation None       Radiology No results found.  Procedures Procedures (including critical care time)  Medications Ordered in ED Medications  cefTRIAXone (ROCEPHIN) injection 250 mg (not administered)  azithromycin (ZITHROMAX) tablet  1,000 mg (not administered)     Initial Impression / Assessment and Plan / ED Course  I have reviewed the triage vital signs and the nursing notes.  Pertinent labs & imaging results that were available during my care of the patient were reviewed by me and considered in my medical decision making (see chart for details).     Treat for urethritis.  Given dose of IM Rocephin and 1 g of azithromycin.  Will also treat for presumed trichomonas with 7 days of Flagyl.  Advised to have all sexual partners evaluated and treated.  Return precautions given.  Final Clinical Impressions(s) / ED Diagnoses   Final diagnoses:  Urethritis    ED Discharge Orders        Ordered    metroNIDAZOLE (FLAGYL) 500 MG tablet  2 times daily     12/06/16 1327       Loren RacerYelverton, Labresha Mellor, MD 12/06/16 1329

## 2016-12-07 LAB — GC/CHLAMYDIA PROBE AMP (~~LOC~~) NOT AT ARMC
Chlamydia: NEGATIVE
Neisseria Gonorrhea: POSITIVE — AB

## 2017-08-02 ENCOUNTER — Encounter (HOSPITAL_BASED_OUTPATIENT_CLINIC_OR_DEPARTMENT_OTHER): Payer: Self-pay | Admitting: Emergency Medicine

## 2017-08-02 ENCOUNTER — Other Ambulatory Visit: Payer: Self-pay

## 2017-08-02 ENCOUNTER — Emergency Department (HOSPITAL_BASED_OUTPATIENT_CLINIC_OR_DEPARTMENT_OTHER)
Admission: EM | Admit: 2017-08-02 | Discharge: 2017-08-02 | Disposition: A | Discharge status: Home / Self Care | Payer: Self-pay | Attending: Emergency Medicine | Admitting: Emergency Medicine

## 2017-08-02 DIAGNOSIS — Z87891 Personal history of nicotine dependence: Secondary | ICD-10-CM | POA: Insufficient documentation

## 2017-08-02 DIAGNOSIS — Z202 Contact with and (suspected) exposure to infections with a predominantly sexual mode of transmission: Secondary | ICD-10-CM | POA: Insufficient documentation

## 2017-08-02 HISTORY — DX: Accidental discharge from unspecified firearms or gun, initial encounter: W34.00XA

## 2017-08-02 MED ORDER — METRONIDAZOLE 500 MG PO TABS
2000.0000 mg | ORAL_TABLET | Freq: Once | ORAL | Status: AC
  Administered 2017-08-02: 2000 mg via ORAL
  Filled 2017-08-02: qty 4

## 2017-08-02 MED ORDER — CEFTRIAXONE SODIUM 250 MG IJ SOLR
250.0000 mg | Freq: Once | INTRAMUSCULAR | Status: AC
  Administered 2017-08-02: 250 mg via INTRAMUSCULAR
  Filled 2017-08-02: qty 250

## 2017-08-02 MED ORDER — AZITHROMYCIN 250 MG PO TABS
1000.0000 mg | ORAL_TABLET | Freq: Once | ORAL | Status: AC
  Administered 2017-08-02: 1000 mg via ORAL
  Filled 2017-08-02: qty 4

## 2017-08-02 MED ORDER — DOXYCYCLINE HYCLATE 100 MG PO CAPS: 100.0000 mg | ORAL_CAPSULE | Freq: Two times a day (BID) | ORAL | 0 refills | Status: DC

## 2017-08-02 NOTE — ED Triage Notes (Signed)
Pt states his sexual partner has gonorrhea and chlamydia.
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
## 2017-10-30 ENCOUNTER — Emergency Department (HOSPITAL_BASED_OUTPATIENT_CLINIC_OR_DEPARTMENT_OTHER)
Admission: EM | Admit: 2017-10-30 | Discharge: 2017-10-30 | Disposition: A | Discharge status: Home / Self Care | Payer: Self-pay | Attending: Emergency Medicine | Admitting: Emergency Medicine

## 2017-10-30 ENCOUNTER — Encounter (HOSPITAL_BASED_OUTPATIENT_CLINIC_OR_DEPARTMENT_OTHER): Payer: Self-pay

## 2017-10-30 DIAGNOSIS — Z202 Contact with and (suspected) exposure to infections with a predominantly sexual mode of transmission: Secondary | ICD-10-CM | POA: Insufficient documentation

## 2017-10-30 DIAGNOSIS — Z5321 Procedure and treatment not carried out due to patient leaving prior to being seen by health care provider: Secondary | ICD-10-CM | POA: Insufficient documentation

## 2017-10-30 DIAGNOSIS — R300 Dysuria: Secondary | ICD-10-CM | POA: Insufficient documentation

## 2017-10-30 DIAGNOSIS — R369 Urethral discharge, unspecified: Secondary | ICD-10-CM | POA: Insufficient documentation

## 2017-10-30 NOTE — ED Triage Notes (Signed)
Pt reports STD exposure-penile d/c and dysuria-NAD-steady gait

## 2017-10-31 ENCOUNTER — Encounter (HOSPITAL_BASED_OUTPATIENT_CLINIC_OR_DEPARTMENT_OTHER): Payer: Self-pay

## 2017-10-31 ENCOUNTER — Emergency Department (HOSPITAL_BASED_OUTPATIENT_CLINIC_OR_DEPARTMENT_OTHER)
Admission: EM | Admit: 2017-10-31 | Discharge: 2017-10-31 | Disposition: A | Discharge status: Home / Self Care | Payer: Self-pay | Attending: Emergency Medicine | Admitting: Emergency Medicine

## 2017-10-31 ENCOUNTER — Other Ambulatory Visit: Payer: Self-pay

## 2017-10-31 DIAGNOSIS — Z87891 Personal history of nicotine dependence: Secondary | ICD-10-CM | POA: Insufficient documentation

## 2017-10-31 DIAGNOSIS — N341 Nonspecific urethritis: Secondary | ICD-10-CM | POA: Insufficient documentation

## 2017-10-31 DIAGNOSIS — N342 Other urethritis: Secondary | ICD-10-CM

## 2017-10-31 DIAGNOSIS — Z202 Contact with and (suspected) exposure to infections with a predominantly sexual mode of transmission: Secondary | ICD-10-CM | POA: Insufficient documentation

## 2017-10-31 HISTORY — DX: Gonococcal infection, unspecified: A54.9

## 2017-10-31 MED ORDER — CEFTRIAXONE SODIUM 250 MG IJ SOLR
250.0000 mg | Freq: Once | INTRAMUSCULAR | Status: AC
  Administered 2017-10-31: 250 mg via INTRAMUSCULAR
  Filled 2017-10-31: qty 250

## 2017-10-31 MED ORDER — AZITHROMYCIN 250 MG PO TABS
1000.0000 mg | ORAL_TABLET | Freq: Once | ORAL | Status: AC
  Administered 2017-10-31: 1000 mg via ORAL
  Filled 2017-10-31: qty 4

## 2017-10-31 MED ORDER — LIDOCAINE HCL (PF) 1 % IJ SOLN
INTRAMUSCULAR | Status: AC
  Administered 2017-10-31: 2.1 mL
  Filled 2017-10-31: qty 5

## 2017-10-31 NOTE — ED Notes (Signed)
Patient is A&Ox4.  No signs of distress noted.  Please see providers complete history and physical exam.  
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
## 2017-10-31 NOTE — ED Notes (Signed)
PT states understanding of care given, follow up care. PT ambulated from ED to car with a steady gait.  

## 2017-11-01 LAB — GC/CHLAMYDIA PROBE AMP (~~LOC~~) NOT AT ARMC
CHLAMYDIA, DNA PROBE: NEGATIVE
NEISSERIA GONORRHEA: NEGATIVE

## 2017-11-01 LAB — HIV ANTIBODY (ROUTINE TESTING W REFLEX): HIV Screen 4th Generation wRfx: NONREACTIVE

## 2017-11-01 LAB — RPR: RPR: NONREACTIVE

## 2018-04-01 ENCOUNTER — Other Ambulatory Visit: Payer: Self-pay

## 2018-04-01 ENCOUNTER — Ambulatory Visit (HOSPITAL_COMMUNITY)
Admission: EM | Admit: 2018-04-01 | Discharge: 2018-04-01 | Disposition: A | Discharge status: Home / Self Care | Payer: Self-pay | Attending: Family Medicine | Admitting: Family Medicine

## 2018-04-01 ENCOUNTER — Encounter (HOSPITAL_COMMUNITY): Payer: Self-pay

## 2018-04-01 DIAGNOSIS — Z113 Encounter for screening for infections with a predominantly sexual mode of transmission: Secondary | ICD-10-CM

## 2018-04-01 DIAGNOSIS — Z202 Contact with and (suspected) exposure to infections with a predominantly sexual mode of transmission: Secondary | ICD-10-CM | POA: Insufficient documentation

## 2018-04-01 MED ORDER — AZITHROMYCIN 250 MG PO TABS
ORAL_TABLET | ORAL | Status: AC
  Filled 2018-04-01: qty 4

## 2018-04-01 MED ORDER — CEFTRIAXONE SODIUM 250 MG IJ SOLR
250.0000 mg | Freq: Once | INTRAMUSCULAR | Status: AC
  Administered 2018-04-01: 250 mg via INTRAMUSCULAR

## 2018-04-01 MED ORDER — AZITHROMYCIN 250 MG PO TABS
1000.0000 mg | ORAL_TABLET | Freq: Once | ORAL | Status: AC
  Administered 2018-04-01: 1000 mg via ORAL

## 2018-04-01 MED ORDER — CEFTRIAXONE SODIUM 250 MG IJ SOLR
INTRAMUSCULAR | Status: AC
  Filled 2018-04-01: qty 250

## 2018-04-01 NOTE — ED Notes (Signed)
Patient verbalizes understanding of discharge instructions. Opportunity for questioning and answers were provided. Patient discharged from [facility] by RN.  

## 2018-04-01 NOTE — Discharge Instructions (Addendum)
We have treated you for gonorrhea and chlamydia today Your urine will be sent for testing and we will call you with any positive results Please refrain from sexual activity for at least 7 days

## 2018-04-01 NOTE — ED Triage Notes (Signed)
Patient presents to Urgent Care with complaints of penile discharge and burning with urination since 2 days ago. Patient states his girlfriend notified the pt that she was positive for gonorrhea and chlamydia.
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
## 2018-04-02 LAB — URINE CYTOLOGY ANCILLARY ONLY
CHLAMYDIA, DNA PROBE: NEGATIVE
Neisseria Gonorrhea: POSITIVE — AB
Trichomonas: NEGATIVE

## 2018-04-04 ENCOUNTER — Telehealth (HOSPITAL_COMMUNITY): Payer: Self-pay | Admitting: Emergency Medicine

## 2018-04-04 NOTE — Telephone Encounter (Signed)
Test for gonorrhea was positive. This was treated at the urgent care visit with IM rocephin 250mg and po zithromax 1g. Pt needs education to refrain from sexual intercourse for 7 days after treatment to give the medicine time to work. Sexual partners need to be notified and tested/treated. Condoms may reduce risk of reinfection. Recheck or followup with PCP for further evaluation if symptoms are not improving. [facility] notified.   Patient contacted and made aware of all results, all questions answered.   

## 2021-04-25 ENCOUNTER — Emergency Department (HOSPITAL_COMMUNITY): Payer: Medicaid Other

## 2021-04-25 ENCOUNTER — Encounter (HOSPITAL_COMMUNITY): Payer: Self-pay

## 2021-04-25 ENCOUNTER — Encounter (HOSPITAL_COMMUNITY): Admission: EM | Disposition: E | Payer: Self-pay | Source: Home / Self Care

## 2021-04-25 ENCOUNTER — Inpatient Hospital Stay (HOSPITAL_COMMUNITY): Payer: Medicaid Other

## 2021-04-25 ENCOUNTER — Inpatient Hospital Stay (HOSPITAL_COMMUNITY): Payer: Medicaid Other | Admitting: Certified Registered Nurse Anesthetist

## 2021-04-25 ENCOUNTER — Inpatient Hospital Stay (HOSPITAL_COMMUNITY)
Admission: EM | Admit: 2021-04-25 | Discharge: 2021-07-02 | DRG: 957 | Disposition: E | Payer: Medicaid Other | Attending: Thoracic Surgery (Cardiothoracic Vascular Surgery) | Admitting: Thoracic Surgery (Cardiothoracic Vascular Surgery)

## 2021-04-25 DIAGNOSIS — D75838 Other thrombocytosis: Secondary | ICD-10-CM | POA: Diagnosis not present

## 2021-04-25 DIAGNOSIS — I509 Heart failure, unspecified: Secondary | ICD-10-CM | POA: Diagnosis not present

## 2021-04-25 DIAGNOSIS — F430 Acute stress reaction: Secondary | ICD-10-CM | POA: Diagnosis not present

## 2021-04-25 DIAGNOSIS — S52252C Displaced comminuted fracture of shaft of ulna, left arm, initial encounter for open fracture type IIIA, IIIB, or IIIC: Secondary | ICD-10-CM | POA: Diagnosis present

## 2021-04-25 DIAGNOSIS — F10231 Alcohol dependence with withdrawal delirium: Secondary | ICD-10-CM | POA: Diagnosis not present

## 2021-04-25 DIAGNOSIS — N170 Acute kidney failure with tubular necrosis: Secondary | ICD-10-CM | POA: Diagnosis not present

## 2021-04-25 DIAGNOSIS — J948 Other specified pleural conditions: Secondary | ICD-10-CM | POA: Diagnosis not present

## 2021-04-25 DIAGNOSIS — E781 Pure hyperglyceridemia: Secondary | ICD-10-CM | POA: Diagnosis not present

## 2021-04-25 DIAGNOSIS — I82621 Acute embolism and thrombosis of deep veins of right upper extremity: Secondary | ICD-10-CM | POA: Diagnosis present

## 2021-04-25 DIAGNOSIS — D500 Iron deficiency anemia secondary to blood loss (chronic): Secondary | ICD-10-CM

## 2021-04-25 DIAGNOSIS — R451 Restlessness and agitation: Secondary | ICD-10-CM | POA: Diagnosis not present

## 2021-04-25 DIAGNOSIS — J80 Acute respiratory distress syndrome: Secondary | ICD-10-CM | POA: Diagnosis not present

## 2021-04-25 DIAGNOSIS — S5292XF Unspecified fracture of left forearm, subsequent encounter for open fracture type IIIA, IIIB, or IIIC with routine healing: Secondary | ICD-10-CM | POA: Diagnosis not present

## 2021-04-25 DIAGNOSIS — E874 Mixed disorder of acid-base balance: Secondary | ICD-10-CM | POA: Diagnosis not present

## 2021-04-25 DIAGNOSIS — D6489 Other specified anemias: Secondary | ICD-10-CM | POA: Diagnosis not present

## 2021-04-25 DIAGNOSIS — J14 Pneumonia due to Hemophilus influenzae: Secondary | ICD-10-CM | POA: Diagnosis not present

## 2021-04-25 DIAGNOSIS — J90 Pleural effusion, not elsewhere classified: Secondary | ICD-10-CM | POA: Diagnosis not present

## 2021-04-25 DIAGNOSIS — T07XXXA Unspecified multiple injuries, initial encounter: Secondary | ICD-10-CM | POA: Diagnosis not present

## 2021-04-25 DIAGNOSIS — D649 Anemia, unspecified: Secondary | ICD-10-CM | POA: Diagnosis not present

## 2021-04-25 DIAGNOSIS — R571 Hypovolemic shock: Secondary | ICD-10-CM | POA: Diagnosis present

## 2021-04-25 DIAGNOSIS — J9382 Other air leak: Secondary | ICD-10-CM | POA: Diagnosis not present

## 2021-04-25 DIAGNOSIS — E8770 Fluid overload, unspecified: Secondary | ICD-10-CM | POA: Diagnosis not present

## 2021-04-25 DIAGNOSIS — E871 Hypo-osmolality and hyponatremia: Secondary | ICD-10-CM | POA: Diagnosis not present

## 2021-04-25 DIAGNOSIS — Z515 Encounter for palliative care: Secondary | ICD-10-CM

## 2021-04-25 DIAGNOSIS — S271XXA Traumatic hemothorax, initial encounter: Secondary | ICD-10-CM | POA: Diagnosis not present

## 2021-04-25 DIAGNOSIS — R410 Disorientation, unspecified: Secondary | ICD-10-CM | POA: Diagnosis not present

## 2021-04-25 DIAGNOSIS — R6521 Severe sepsis with septic shock: Secondary | ICD-10-CM | POA: Diagnosis not present

## 2021-04-25 DIAGNOSIS — Z66 Do not resuscitate: Secondary | ICD-10-CM | POA: Diagnosis not present

## 2021-04-25 DIAGNOSIS — W3400XD Accidental discharge from unspecified firearms or gun, subsequent encounter: Secondary | ICD-10-CM | POA: Diagnosis not present

## 2021-04-25 DIAGNOSIS — S5292XC Unspecified fracture of left forearm, initial encounter for open fracture type IIIA, IIIB, or IIIC: Secondary | ICD-10-CM

## 2021-04-25 DIAGNOSIS — S5292XB Unspecified fracture of left forearm, initial encounter for open fracture type I or II: Secondary | ICD-10-CM | POA: Diagnosis not present

## 2021-04-25 DIAGNOSIS — S272XXA Traumatic hemopneumothorax, initial encounter: Principal | ICD-10-CM | POA: Diagnosis present

## 2021-04-25 DIAGNOSIS — S52202F Unspecified fracture of shaft of left ulna, subsequent encounter for open fracture type IIIA, IIIB, or IIIC with routine healing: Secondary | ICD-10-CM | POA: Diagnosis not present

## 2021-04-25 DIAGNOSIS — S01421A Laceration with foreign body of right cheek and temporomandibular area, initial encounter: Secondary | ICD-10-CM

## 2021-04-25 DIAGNOSIS — Z9911 Dependence on respirator [ventilator] status: Secondary | ICD-10-CM

## 2021-04-25 DIAGNOSIS — E870 Hyperosmolality and hypernatremia: Secondary | ICD-10-CM | POA: Diagnosis not present

## 2021-04-25 DIAGNOSIS — J15212 Pneumonia due to Methicillin resistant Staphylococcus aureus: Secondary | ICD-10-CM | POA: Diagnosis not present

## 2021-04-25 DIAGNOSIS — Y9289 Other specified places as the place of occurrence of the external cause: Secondary | ICD-10-CM

## 2021-04-25 DIAGNOSIS — S52202C Unspecified fracture of shaft of left ulna, initial encounter for open fracture type IIIA, IIIB, or IIIC: Secondary | ICD-10-CM | POA: Diagnosis not present

## 2021-04-25 DIAGNOSIS — S51832A Puncture wound without foreign body of left forearm, initial encounter: Secondary | ICD-10-CM

## 2021-04-25 DIAGNOSIS — F419 Anxiety disorder, unspecified: Secondary | ICD-10-CM | POA: Diagnosis not present

## 2021-04-25 DIAGNOSIS — Z7189 Other specified counseling: Secondary | ICD-10-CM | POA: Diagnosis not present

## 2021-04-25 DIAGNOSIS — E875 Hyperkalemia: Secondary | ICD-10-CM | POA: Diagnosis not present

## 2021-04-25 DIAGNOSIS — J9601 Acute respiratory failure with hypoxia: Secondary | ICD-10-CM | POA: Diagnosis not present

## 2021-04-25 DIAGNOSIS — I160 Hypertensive urgency: Secondary | ICD-10-CM | POA: Diagnosis not present

## 2021-04-25 DIAGNOSIS — S55012A Laceration of ulnar artery at forearm level, left arm, initial encounter: Secondary | ICD-10-CM | POA: Diagnosis present

## 2021-04-25 DIAGNOSIS — E876 Hypokalemia: Secondary | ICD-10-CM | POA: Diagnosis present

## 2021-04-25 DIAGNOSIS — S27321A Contusion of lung, unilateral, initial encounter: Secondary | ICD-10-CM | POA: Diagnosis present

## 2021-04-25 DIAGNOSIS — R578 Other shock: Secondary | ICD-10-CM | POA: Diagnosis present

## 2021-04-25 DIAGNOSIS — D62 Acute posthemorrhagic anemia: Secondary | ICD-10-CM | POA: Diagnosis present

## 2021-04-25 DIAGNOSIS — Z781 Physical restraint status: Secondary | ICD-10-CM

## 2021-04-25 DIAGNOSIS — A419 Sepsis, unspecified organism: Secondary | ICD-10-CM | POA: Diagnosis not present

## 2021-04-25 DIAGNOSIS — L8990 Pressure ulcer of unspecified site, unspecified stage: Secondary | ICD-10-CM | POA: Insufficient documentation

## 2021-04-25 DIAGNOSIS — W3400XA Accidental discharge from unspecified firearms or gun, initial encounter: Secondary | ICD-10-CM | POA: Diagnosis not present

## 2021-04-25 DIAGNOSIS — S2231XB Fracture of one rib, right side, initial encounter for open fracture: Secondary | ICD-10-CM | POA: Diagnosis present

## 2021-04-25 DIAGNOSIS — S0240EB Zygomatic fracture, right side, initial encounter for open fracture: Secondary | ICD-10-CM | POA: Diagnosis present

## 2021-04-25 DIAGNOSIS — S52352C Displaced comminuted fracture of shaft of radius, left arm, initial encounter for open fracture type IIIA, IIIB, or IIIC: Secondary | ICD-10-CM | POA: Diagnosis present

## 2021-04-25 DIAGNOSIS — I82611 Acute embolism and thrombosis of superficial veins of right upper extremity: Secondary | ICD-10-CM | POA: Diagnosis present

## 2021-04-25 DIAGNOSIS — T794XXA Traumatic shock, initial encounter: Secondary | ICD-10-CM | POA: Diagnosis present

## 2021-04-25 DIAGNOSIS — M7989 Other specified soft tissue disorders: Secondary | ICD-10-CM | POA: Diagnosis not present

## 2021-04-25 DIAGNOSIS — G9340 Encephalopathy, unspecified: Secondary | ICD-10-CM | POA: Diagnosis not present

## 2021-04-25 DIAGNOSIS — U071 COVID-19: Secondary | ICD-10-CM | POA: Diagnosis not present

## 2021-04-25 DIAGNOSIS — S01311A Laceration without foreign body of right ear, initial encounter: Secondary | ICD-10-CM

## 2021-04-25 DIAGNOSIS — S52202B Unspecified fracture of shaft of left ulna, initial encounter for open fracture type I or II: Secondary | ICD-10-CM | POA: Diagnosis not present

## 2021-04-25 DIAGNOSIS — D696 Thrombocytopenia, unspecified: Secondary | ICD-10-CM | POA: Diagnosis present

## 2021-04-25 DIAGNOSIS — J9312 Secondary spontaneous pneumothorax: Secondary | ICD-10-CM | POA: Diagnosis not present

## 2021-04-25 DIAGNOSIS — J9602 Acute respiratory failure with hypercapnia: Secondary | ICD-10-CM | POA: Diagnosis not present

## 2021-04-25 DIAGNOSIS — J942 Hemothorax: Secondary | ICD-10-CM | POA: Diagnosis present

## 2021-04-25 DIAGNOSIS — J984 Other disorders of lung: Secondary | ICD-10-CM | POA: Diagnosis not present

## 2021-04-25 DIAGNOSIS — S0181XA Laceration without foreign body of other part of head, initial encounter: Secondary | ICD-10-CM | POA: Diagnosis present

## 2021-04-25 DIAGNOSIS — J9691 Respiratory failure, unspecified with hypoxia: Secondary | ICD-10-CM | POA: Diagnosis not present

## 2021-04-25 DIAGNOSIS — R0902 Hypoxemia: Secondary | ICD-10-CM | POA: Diagnosis not present

## 2021-04-25 HISTORY — PX: EXTERNAL FIXATION ARM: SHX1552

## 2021-04-25 HISTORY — PX: THROMBECTOMY AND REVISION OF ARTERIOVENTOUS (AV) GORETEX  GRAFT: SHX6120

## 2021-04-25 LAB — CBC
HCT: 36 % — ABNORMAL LOW (ref 39.0–52.0)
Hemoglobin: 12.5 g/dL — ABNORMAL LOW (ref 13.0–17.0)
MCH: 30.8 pg (ref 26.0–34.0)
MCHC: 34.7 g/dL (ref 30.0–36.0)
MCV: 88.7 fL (ref 80.0–100.0)
Platelets: 75 10*3/uL — ABNORMAL LOW (ref 150–400)
RBC: 4.06 MIL/uL — ABNORMAL LOW (ref 4.22–5.81)
RDW: 15.3 % (ref 11.5–15.5)
WBC: 15.4 10*3/uL — ABNORMAL HIGH (ref 4.0–10.5)
nRBC: 0 % (ref 0.0–0.2)

## 2021-04-25 LAB — POCT I-STAT 7, (LYTES, BLD GAS, ICA,H+H)
Acid-base deficit: 11 mmol/L — ABNORMAL HIGH (ref 0.0–2.0)
Acid-base deficit: 3 mmol/L — ABNORMAL HIGH (ref 0.0–2.0)
Bicarbonate: 17.5 mmol/L — ABNORMAL LOW (ref 20.0–28.0)
Bicarbonate: 22.5 mmol/L (ref 20.0–28.0)
Calcium, Ion: 1.23 mmol/L (ref 1.15–1.40)
Calcium, Ion: 1.2 mmol/L (ref 1.15–1.40)
HCT: 31 % — ABNORMAL LOW (ref 39.0–52.0)
HCT: 33 % — ABNORMAL LOW (ref 39.0–52.0)
Hemoglobin: 10.5 g/dL — ABNORMAL LOW (ref 13.0–17.0)
Hemoglobin: 11.2 g/dL — ABNORMAL LOW (ref 13.0–17.0)
O2 Saturation: 98 %
O2 Saturation: 100 %
Patient temperature: 98.7
Potassium: 4.5 mmol/L (ref 3.5–5.1)
Potassium: 4.1 mmol/L (ref 3.5–5.1)
Sodium: 139 mmol/L (ref 135–145)
Sodium: 141 mmol/L (ref 135–145)
TCO2: 19 mmol/L — ABNORMAL LOW (ref 22–32)
TCO2: 24 mmol/L (ref 22–32)
pCO2 arterial: 50 mmHg — ABNORMAL HIGH (ref 32–48)
pCO2 arterial: 41.7 mmHg (ref 32–48)
pH, Arterial: 7.153 — CL (ref 7.35–7.45)
pH, Arterial: 7.34 — ABNORMAL LOW (ref 7.35–7.45)
pO2, Arterial: 147 mmHg — ABNORMAL HIGH (ref 83–108)
pO2, Arterial: 318 mmHg — ABNORMAL HIGH (ref 83–108)

## 2021-04-25 LAB — COMPREHENSIVE METABOLIC PANEL
ALT: 32 U/L (ref 0–44)
AST: 97 U/L — ABNORMAL HIGH (ref 15–41)
Albumin: 2.4 g/dL — ABNORMAL LOW (ref 3.5–5.0)
Alkaline Phosphatase: 44 U/L (ref 38–126)
Anion gap: 13 (ref 5–15)
BUN: 9 mg/dL (ref 6–20)
CO2: 16 mmol/L — ABNORMAL LOW (ref 22–32)
Calcium: 7.2 mg/dL — ABNORMAL LOW (ref 8.9–10.3)
Chloride: 108 mmol/L (ref 98–111)
Creatinine, Ser: 1.6 mg/dL — ABNORMAL HIGH (ref 0.61–1.24)
GFR, Estimated: 58 mL/min — ABNORMAL LOW (ref >60)
Glucose, Bld: 368 mg/dL — ABNORMAL HIGH (ref 70–99)
Potassium: 5.3 mmol/L — ABNORMAL HIGH (ref 3.5–5.1)
Sodium: 137 mmol/L (ref 135–145)
Total Bilirubin: 1.1 mg/dL (ref 0.3–1.2)
Total Protein: 4 g/dL — ABNORMAL LOW (ref 6.5–8.1)

## 2021-04-25 LAB — URINALYSIS, ROUTINE W REFLEX MICROSCOPIC
Bilirubin Urine: NEGATIVE
Glucose, UA: NEGATIVE mg/dL
Hgb urine dipstick: NEGATIVE
Ketones, ur: NEGATIVE mg/dL
Leukocytes,Ua: NEGATIVE
Nitrite: NEGATIVE
Protein, ur: NEGATIVE mg/dL
Specific Gravity, Urine: <1.005 — ABNORMAL LOW (ref 1.005–1.030)
pH: 7 (ref 5.0–8.0)

## 2021-04-25 LAB — PREPARE RBC (CROSSMATCH)

## 2021-04-25 LAB — GLUCOSE, CAPILLARY
Glucose-Capillary: 76 mg/dL (ref 70–99)
Glucose-Capillary: 103 mg/dL — ABNORMAL HIGH (ref 70–99)
Glucose-Capillary: 115 mg/dL — ABNORMAL HIGH (ref 70–99)
Glucose-Capillary: 115 mg/dL — ABNORMAL HIGH (ref 70–99)

## 2021-04-25 LAB — MRSA NEXT GEN BY PCR, NASAL: MRSA by PCR Next Gen: NOT DETECTED

## 2021-04-25 LAB — HEMOGLOBIN A1C
Hgb A1c MFr Bld: 5.6 % (ref 4.8–5.6)
Mean Plasma Glucose: 114.02 mg/dL

## 2021-04-25 LAB — SAMPLE TO BLOOD BANK

## 2021-04-25 LAB — ETHANOL: Alcohol, Ethyl (B): 49 mg/dL — ABNORMAL HIGH (ref <10)

## 2021-04-25 LAB — LACTIC ACID, PLASMA: Lactic Acid, Venous: 8.2 mmol/L (ref 0.5–1.9)

## 2021-04-25 LAB — ABO/RH: ABO/RH(D): O POS

## 2021-04-25 SURGERY — THROMBECTOMY AND REVISION OF ARTERIOVENTOUS (AV) GORETEX  GRAFT
Anesthesia: General | Site: Arm Upper | Laterality: Left

## 2021-04-25 MED ORDER — FENTANYL 2500MCG IN NS 250ML (10MCG/ML) PREMIX INFUSION
0.0000 ug/h | INTRAVENOUS | Status: DC
  Administered 2021-04-25: 200 ug/h via INTRAVENOUS
  Filled 2021-04-25: qty 250

## 2021-04-25 MED ORDER — METHOCARBAMOL 500 MG PO TABS
1000.0000 mg | ORAL_TABLET | Freq: Three times a day (TID) | ORAL | Status: DC
  Administered 2021-04-25 – 2021-04-27 (×7): 1000 mg
  Filled 2021-04-25 (×9): qty 2

## 2021-04-25 MED ORDER — FENTANYL CITRATE (PF) 250 MCG/5ML IJ SOLN
INTRAMUSCULAR | Status: AC
  Filled 2021-04-25: qty 5

## 2021-04-25 MED ORDER — SUCCINYLCHOLINE CHLORIDE 200 MG/10ML IV SOSY
PREFILLED_SYRINGE | INTRAVENOUS | Status: AC
  Filled 2021-04-25: qty 10

## 2021-04-25 MED ORDER — ACETAMINOPHEN 500 MG PO TABS
1000.0000 mg | ORAL_TABLET | Freq: Four times a day (QID) | ORAL | Status: DC
  Administered 2021-04-25 – 2021-04-28 (×9): 1000 mg
  Filled 2021-04-25 (×10): qty 2

## 2021-04-25 MED ORDER — FENTANYL CITRATE (PF) 100 MCG/2ML IJ SOLN
INTRAMUSCULAR | Status: AC
  Filled 2021-04-25: qty 2

## 2021-04-25 MED ORDER — PROPOFOL 1000 MG/100ML IV EMUL
INTRAVENOUS | Status: AC
  Filled 2021-04-25: qty 100

## 2021-04-25 MED ORDER — VECURONIUM BROMIDE 10 MG IV SOLR
INTRAVENOUS | Status: AC
  Administered 2021-04-25: 10 mg
  Filled 2021-04-25: qty 10

## 2021-04-25 MED ORDER — LACTATED RINGERS IV SOLN: INTRAVENOUS | Status: DC | PRN

## 2021-04-25 MED ORDER — ETOMIDATE 2 MG/ML IV SOLN
INTRAVENOUS | Status: AC | PRN
  Administered 2021-04-25: 20 mg via INTRAVENOUS

## 2021-04-25 MED ORDER — MORPHINE SULFATE (PF) 4 MG/ML IV SOLN: 4.0000 mg | INTRAVENOUS | Status: DC | PRN

## 2021-04-25 MED ORDER — SUCCINYLCHOLINE CHLORIDE 20 MG/ML IJ SOLN
INTRAMUSCULAR | Status: AC | PRN
  Administered 2021-04-25: 200 mg via INTRAVENOUS

## 2021-04-25 MED ORDER — SODIUM CHLORIDE 0.9 % IV SOLN: INTRAVENOUS | Status: DC | PRN

## 2021-04-25 MED ORDER — FENTANYL 2500MCG IN NS 250ML (10MCG/ML) PREMIX INFUSION
0.0000 ug/h | INTRAVENOUS | Status: DC
  Administered 2021-04-25: 150 ug/h via INTRAVENOUS
  Administered 2021-04-25: 50 ug/h via INTRAVENOUS
  Administered 2021-04-26: 300 ug/h via INTRAVENOUS
  Administered 2021-04-26: 200 ug/h via INTRAVENOUS
  Administered 2021-04-27: 400 ug/h via INTRAVENOUS
  Administered 2021-04-27: 300 ug/h via INTRAVENOUS
  Administered 2021-04-27 – 2021-04-28 (×2): 400 ug/h via INTRAVENOUS
  Filled 2021-04-25 (×8): qty 250

## 2021-04-25 MED ORDER — ONDANSETRON HCL 4 MG/2ML IJ SOLN
4.0000 mg | Freq: Four times a day (QID) | INTRAMUSCULAR | Status: DC | PRN
Start: 2021-04-25 — End: 2021-06-16
  Administered 2021-04-28: 4 mg via INTRAVENOUS
  Filled 2021-04-25: qty 2

## 2021-04-25 MED ORDER — CEFAZOLIN SODIUM-DEXTROSE 2-3 GM-%(50ML) IV SOLR
INTRAVENOUS | Status: DC | PRN
  Administered 2021-04-25: 2 g via INTRAVENOUS

## 2021-04-25 MED ORDER — ORAL CARE MOUTH RINSE
15.0000 mL | OROMUCOSAL | Status: DC
  Administered 2021-04-25 – 2021-04-28 (×28): 15 mL via OROMUCOSAL

## 2021-04-25 MED ORDER — ENOXAPARIN SODIUM 30 MG/0.3ML IJ SOSY: 30.0000 mg | PREFILLED_SYRINGE | Freq: Two times a day (BID) | INTRAMUSCULAR | Status: DC

## 2021-04-25 MED ORDER — PHENYLEPHRINE HCL-NACL 20-0.9 MG/250ML-% IV SOLN
INTRAVENOUS | Status: DC | PRN
  Administered 2021-04-25: 40 ug/min via INTRAVENOUS

## 2021-04-25 MED ORDER — HEPARIN 6000 UNIT IRRIGATION SOLUTION
Status: DC | PRN
  Administered 2021-04-25: 1

## 2021-04-25 MED ORDER — LACTATED RINGERS IV BOLUS
1000.0000 mL | Freq: Once | INTRAVENOUS | Status: AC
  Administered 2021-04-25: 1000 mL via INTRAVENOUS

## 2021-04-25 MED ORDER — PHENYLEPHRINE HCL (PRESSORS) 10 MG/ML IV SOLN
INTRAVENOUS | Status: DC | PRN
  Administered 2021-04-25: 80 ug via INTRAVENOUS
  Administered 2021-04-25: 160 ug via INTRAVENOUS

## 2021-04-25 MED ORDER — OXYCODONE HCL 5 MG PO TABS: 5.0000 mg | ORAL_TABLET | ORAL | Status: DC | PRN

## 2021-04-25 MED ORDER — ONDANSETRON 4 MG PO TBDP
4.0000 mg | ORAL_TABLET | Freq: Four times a day (QID) | ORAL | Status: DC | PRN
  Administered 2021-04-26: 4 mg via ORAL
  Filled 2021-04-25 (×2): qty 1

## 2021-04-25 MED ORDER — CHLORHEXIDINE GLUCONATE CLOTH 2 % EX PADS
6.0000 | MEDICATED_PAD | Freq: Every day | CUTANEOUS | Status: DC
  Administered 2021-04-26 – 2021-06-15 (×49): 6 via TOPICAL

## 2021-04-25 MED ORDER — DOCUSATE SODIUM 50 MG/5ML PO LIQD
100.0000 mg | Freq: Two times a day (BID) | ORAL | Status: DC
  Administered 2021-04-25 – 2021-04-27 (×6): 100 mg
  Filled 2021-04-25 (×6): qty 10

## 2021-04-25 MED ORDER — PROPOFOL 1000 MG/100ML IV EMUL
0.0000 ug/kg/min | INTRAVENOUS | Status: DC
Start: 2021-04-25 — End: 2021-04-27
  Administered 2021-04-25: 50 ug/kg/min via INTRAVENOUS
  Administered 2021-04-25: 40 ug/kg/min via INTRAVENOUS
  Administered 2021-04-25: 75 ug/kg/min via INTRAVENOUS
  Administered 2021-04-25 – 2021-04-27 (×12): 50 ug/kg/min via INTRAVENOUS
  Filled 2021-04-25 (×3): qty 100
  Filled 2021-04-25: qty 200
  Filled 2021-04-25 (×11): qty 100

## 2021-04-25 MED ORDER — ROCURONIUM BROMIDE 100 MG/10ML IV SOLN
INTRAVENOUS | Status: DC | PRN
  Administered 2021-04-25: 40 mg via INTRAVENOUS
  Administered 2021-04-25: 60 mg via INTRAVENOUS

## 2021-04-25 MED ORDER — FENTANYL CITRATE PF 50 MCG/ML IJ SOSY
PREFILLED_SYRINGE | INTRAMUSCULAR | Status: AC | PRN
  Administered 2021-04-25: 100 ug via INTRAVENOUS

## 2021-04-25 MED ORDER — FENTANYL CITRATE (PF) 100 MCG/2ML IJ SOLN
INTRAMUSCULAR | Status: DC | PRN
Start: 2021-04-25 — End: 2021-04-25
  Administered 2021-04-25: 150 ug via INTRAVENOUS
  Administered 2021-04-25 (×2): 50 ug via INTRAVENOUS

## 2021-04-25 MED ORDER — STERILE WATER FOR INJECTION IJ SOLN
INTRAMUSCULAR | Status: AC
  Filled 2021-04-25: qty 10

## 2021-04-25 MED ORDER — FENTANYL BOLUS VIA INFUSION
50.0000 ug | INTRAVENOUS | Status: DC | PRN
  Administered 2021-04-25 – 2021-04-28 (×5): 100 ug via INTRAVENOUS
  Filled 2021-04-25: qty 100

## 2021-04-25 MED ORDER — CALCIUM CHLORIDE 10 % IV SOLN
INTRAVENOUS | Status: DC | PRN
  Administered 2021-04-25 (×3): 250 mg via INTRAVENOUS

## 2021-04-25 MED ORDER — INSULIN ASPART 100 UNIT/ML IJ SOLN
0.0000 [IU] | INTRAMUSCULAR | Status: DC
  Administered 2021-04-26 – 2021-04-27 (×3): 2 [IU] via SUBCUTANEOUS

## 2021-04-25 MED ORDER — CHLORHEXIDINE GLUCONATE 0.12% ORAL RINSE (MEDLINE KIT)
15.0000 mL | Freq: Two times a day (BID) | OROMUCOSAL | Status: DC
  Administered 2021-04-25 – 2021-05-06 (×23): 15 mL via OROMUCOSAL

## 2021-04-25 MED ORDER — FENTANYL CITRATE PF 50 MCG/ML IJ SOSY: 50.0000 ug | PREFILLED_SYRINGE | Freq: Once | INTRAMUSCULAR | Status: DC

## 2021-04-25 MED ORDER — LACTATED RINGERS IV SOLN: INTRAVENOUS | Status: DC

## 2021-04-25 MED ORDER — PHENYLEPHRINE HCL (PRESSORS) 10 MG/ML IV SOLN: INTRAVENOUS | Status: DC | PRN

## 2021-04-25 MED ORDER — 0.9 % SODIUM CHLORIDE (POUR BTL) OPTIME
TOPICAL | Status: DC | PRN
  Administered 2021-04-25: 1000 mL

## 2021-04-25 MED ORDER — POLYETHYLENE GLYCOL 3350 17 G PO PACK
17.0000 g | PACK | Freq: Every day | ORAL | Status: DC
  Administered 2021-04-25 – 2021-04-27 (×3): 17 g
  Filled 2021-04-25 (×3): qty 1

## 2021-04-25 MED ORDER — PHENYLEPHRINE HCL-NACL 20-0.9 MG/250ML-% IV SOLN
INTRAVENOUS | Status: AC
  Filled 2021-04-25: qty 250

## 2021-04-25 MED ORDER — PANTOPRAZOLE SODIUM 40 MG IV SOLR
40.0000 mg | Freq: Every day | INTRAVENOUS | Status: DC
  Administered 2021-04-25: 40 mg via INTRAVENOUS
  Filled 2021-04-25: qty 10

## 2021-04-25 MED ORDER — PANTOPRAZOLE SODIUM 40 MG PO TBEC: 40.0000 mg | DELAYED_RELEASE_TABLET | Freq: Every day | ORAL | Status: DC

## 2021-04-25 MED ORDER — SUCCINYLCHOLINE CHLORIDE 20 MG/ML IJ SOLN
INTRAMUSCULAR | Status: AC | PRN
  Administered 2021-04-25: 100 mg via INTRAVENOUS

## 2021-04-25 MED ORDER — IOHEXOL 350 MG/ML SOLN
125.0000 mL | Freq: Once | INTRAVENOUS | Status: AC | PRN
  Administered 2021-04-25: 125 mL via INTRAVENOUS

## 2021-04-25 SURGICAL SUPPLY — 51 items
APL PRP STRL LF DISP 70% ISPRP (MISCELLANEOUS) ×2
APL SKNCLS STERI-STRIP NONHPOA (GAUZE/BANDAGES/DRESSINGS) ×2
ARMBAND PINK RESTRICT EXTREMIT (MISCELLANEOUS) ×8 IMPLANT
BAG COUNTER SPONGE SURGICOUNT (BAG) ×4 IMPLANT
BAG SPNG CNTER NS LX DISP (BAG) ×2
BAR EXFX 150X11 NS LF (EXFIX) ×2
BAR EXFX 250X11 NS LF (EXFIX) ×2
BAR GLASS FIBER EXFX 11X150 (EXFIX) ×1 IMPLANT
BAR GLASS FIBER EXFX 11X250 (EXFIX) ×1 IMPLANT
BENZOIN TINCTURE PRP APPL 2/3 (GAUZE/BANDAGES/DRESSINGS) ×4 IMPLANT
BLADE 15 SAFETY STRL DISP (BLADE) ×1 IMPLANT
BNDG ELASTIC 4X5.8 VLCR STR LF (GAUZE/BANDAGES/DRESSINGS) ×2 IMPLANT
BNDG GAUZE ELAST 4 BULKY (GAUZE/BANDAGES/DRESSINGS) ×1 IMPLANT
CANISTER SUCT 3000ML PPV (MISCELLANEOUS) ×4 IMPLANT
CHLORAPREP W/TINT 26 (MISCELLANEOUS) ×4 IMPLANT
CLAMP BLUE BAR TO BAR (EXFIX) ×1 IMPLANT
CLAMP BLUE BAR TO PIN (EXFIX) ×4 IMPLANT
CLIP TI WIDE RED SMALL 24 (CLIP) ×1 IMPLANT
DRAPE TABLE BACK 80X90 (DRAPES) ×1 IMPLANT
ELECT REM PT RETURN 9FT ADLT (ELECTROSURGICAL) ×3
ELECTRODE REM PT RTRN 9FT ADLT (ELECTROSURGICAL) ×3 IMPLANT
GAUZE SPONGE 4X4 12PLY STRL (GAUZE/BANDAGES/DRESSINGS) ×1 IMPLANT
GAUZE XEROFORM 5X9 LF (GAUZE/BANDAGES/DRESSINGS) ×1 IMPLANT
GLOVE SURG SS PI 8.0 STRL IVOR (GLOVE) ×4 IMPLANT
GOWN STRL REUS W/ TWL LRG LVL3 (GOWN DISPOSABLE) ×6 IMPLANT
GOWN STRL REUS W/ TWL XL LVL3 (GOWN DISPOSABLE) ×3 IMPLANT
GOWN STRL REUS W/TWL LRG LVL3 (GOWN DISPOSABLE) ×6
GOWN STRL REUS W/TWL XL LVL3 (GOWN DISPOSABLE) ×3
HALF PIN 5.0X160 (EXFIX) ×1 IMPLANT
INSERT FOGARTY SM (MISCELLANEOUS) ×4 IMPLANT
KIT BASIN OR (CUSTOM PROCEDURE TRAY) ×4 IMPLANT
KIT TURNOVER KIT B (KITS) ×4 IMPLANT
NS IRRIG 1000ML POUR BTL (IV SOLUTION) ×4 IMPLANT
PACK CV ACCESS (CUSTOM PROCEDURE TRAY) ×4 IMPLANT
PAD ARMBOARD 7.5X6 YLW CONV (MISCELLANEOUS) ×8 IMPLANT
PIN HALF ORANGE 5X200X45MM (EXFIX) ×2 IMPLANT
PIN HALF YELLOW 5X160X35 (EXFIX) ×1 IMPLANT
SLING ARM FOAM STRAP LRG (SOFTGOODS) IMPLANT
SPONGE T-LAP 18X18 ~~LOC~~+RFID (SPONGE) ×2 IMPLANT
STRIP CLOSURE SKIN 1/2X4 (GAUZE/BANDAGES/DRESSINGS) ×4 IMPLANT
SUT ETHILON 2 0 PSLX (SUTURE) ×1 IMPLANT
SUT MNCRL AB 4-0 PS2 18 (SUTURE) IMPLANT
SUT PROLENE 6 0 BV (SUTURE) ×8 IMPLANT
SUT SILK 2 0 SH (SUTURE) ×1 IMPLANT
SUT VIC AB 2-0 CT1 27 (SUTURE) ×3
SUT VIC AB 2-0 CT1 TAPERPNT 27 (SUTURE) IMPLANT
SUT VIC AB 3-0 SH 27 (SUTURE) ×6
SUT VIC AB 3-0 SH 27X BRD (SUTURE) ×6 IMPLANT
TOWEL GREEN STERILE (TOWEL DISPOSABLE) ×4 IMPLANT
UNDERPAD 30X36 HEAVY ABSORB (UNDERPADS AND DIAPERS) ×4 IMPLANT
WATER STERILE IRR 1000ML POUR (IV SOLUTION) ×4 IMPLANT

## 2021-04-25 NOTE — Transfer of Care (Signed)
Immediate Anesthesia Transfer of Care Note  Patient: Nathan West  Procedure(s) Performed: REPAIR BRACHIAL ARTERY (Left: Arm Upper) EXTERNAL FIXATION ARM (Left)  Patient Location: ICU  Anesthesia Type:General  Level of Consciousness: Patient remains intubated per anesthesia plan  Airway & Oxygen Therapy: Patient remains intubated per anesthesia plan and Patient placed on Ventilator (see vital sign flow sheet for setting)  Post-op Assessment: Report given to RN and Post -op Vital signs reviewed and unstable, Anesthesiologist notified  Post vital signs: Reviewed and stable  Last Vitals:  Vitals Value Taken Time  BP 125/74 May 14, 2021 0530  Temp    Pulse 62 05-14-2021 0557  Resp 20 May 14, 2021 0557  SpO2 100 % 05/14/21 0557  Vitals shown include unvalidated device data.  Last Pain:  Vitals:   05/14/21 0109  TempSrc: Tympanic         Complications: No notable events documented.
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
## 2021-04-25 NOTE — Progress Notes (Signed)
Received patient from OR. Placed on mechanical ventilation, previous settings of 520-20-100% +5. ETT secured 24@ lip.
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
## 2021-04-25 NOTE — Progress Notes (Signed)
RT transported patent to CT and back with RN. No complications. RT will continue to monitor.

## 2021-04-25 NOTE — Progress Notes (Signed)
Orthopedic Tech Progress Note Patient Details:  Nathan West 27-Jan-1990 [id number]  Patient ID: Nathan West, male   DOB: 07-31-1990, 31 y.o.   MRN: [id number] I attended trauma page. Trinna Post 04/17/2021, 4:05 AM
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
## 2021-04-25 NOTE — Anesthesia Procedure Notes (Signed)
Arterial Line Insertion Start/End04/16/2023 3:21 AM, 04/25/2021 3:28 AM Performed by: Val Eagle, MD, anesthesiologist  Patient location: Pre-op. Preanesthetic checklist: patient identified, IV checked, site marked, risks and benefits discussed, surgical consent, monitors and equipment checked, pre-op evaluation, timeout performed and anesthesia consent Right, radial was placed Catheter size: 20 G Hand hygiene performed  and maximum sterile barriers used   Attempts: 1 Procedure performed without using ultrasound guided technique. Following insertion, dressing applied and Biopatch. Post procedure assessment: normal and unchanged  Patient tolerated the procedure well with no immediate complications.

## 2021-04-25 NOTE — Op Note (Signed)
   Procedure Note  Date: 2021-05-16  Procedure: tube thoracostomy--right    Pre-op diagnosis: right pneumohemothorax  Post-op diagnosis: same  Surgeon: Diamantina Monks, MD  Anesthesia: local   EBL: <5cc procedural; 10cc blood evacuated Drains/Implants: 24F chest tube Specimen: none  Description of procedure: Time-out was performed verifying correct patient, procedure, site, laterality. This procedure was performed emergently and therefore informed consent was not obtained.  A longitudinal incision was made parallel to the rib at the fourth intercostal space. This incision was deepened down through the muscle until the pleural cavity was entered. Blood was encountered upon entry and a 24F chest tube was inserted through this tract.   The tube was secured at the skin with suture and connected to an atrium at -20cm water wall suction. Immediate output from the chest tube was 10cc and was bloody. The site was dressed with xeroform, gauze, and tape. The patient tolerated the procedure well. There were no complications. Follow up chest x-ray was ordered to confirm tube positioning, complete evacuation, and complete lung re-expansion.    Diamantina Monks, MD General and Trauma Surgery [facility] Surgery
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
## 2021-04-25 NOTE — Progress Notes (Signed)
Inpatient Diabetes Program Recommendations  AACE/ADA: New Consensus Statement on Inpatient Glycemic Control (2015)  Target Ranges:  Prepandial:   less than 140 mg/dL      Peak postprandial:   less than 180 mg/dL (1-2 hours)      Critically ill patients:  140 - 180 mg/dL   No results found for: GLUCAP, HGBA1C  Review of Glycemic Control  Latest Reference Range & Units 04/24/2021 02:07  Glucose 70 - 99 mg/dL 368 (H)   Diabetes history: None  Current orders for Inpatient glycemic control: None  Inpatient Diabetes Program Recommendations:    - Consider CBG checks Q4, and possibly Novolog 0-9 units correction if elevated.  Thanks,  Tama Headings RN, MSN, BC-ADM Inpatient Diabetes Coordinator Team Pager [phone] (8a-5p)
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
## 2021-04-25 NOTE — Brief Op Note (Signed)
   Brief Op Note  Date of Surgery: 04/15/2021  Preoperative Diagnosis: Left Arm Trauma  Postoperative Diagnosis: same  Procedure: Procedure(s): REPAIR BRACHIAL ARTERY EXTERNAL FIXATION ARM  Implants: * No implants in log *  Surgeons: Surgeon(s): Leonie Douglas, MD Huel Cote, MD  Anesthesia: General    Estimated Blood Loss: See anesthesia record  Complications: None  Condition to PACU: Stable  Benancio Deeds, MD 04/04/2021 5:05 AM

## 2021-04-25 NOTE — Progress Notes (Signed)
   Subjective:  Patient remains intubated and sedated with ongoing critical care management.  Objective:   VITALS:   Vitals:   04/22/2021 1610 04/16/2021 1700 04/28/2021 1800 04/28/2021 1900  BP:  118/71 107/67 101/72  Pulse: 81 90 91 90  Resp: 20 20 20 20   Temp: 97.7 F (36.5 C) 98.6 F (37 C) 98.4 F (36.9 C) 98.4 F (36.9 C)  TempSrc:      SpO2: 100% 98% 97% 98%   Left upper extremity has an external fixator.  Unable to obtain neuro exam given his sedated status.  Ex-Fix sites are clean dry intact.  There is some swelling about the upper extremity.  Compartments of the forearm and upper arm as well as hand are all soft and easily palpable.  No concern for compartment syndrome.  He has a palpable radial pulse.  Less than 2-second cap refill in all digits  Lab Results  Component Value Date   WBC 15.4 (H) 04/18/2021   HGB 11.2 (L) 04/23/2021   HCT 33.0 (L) 04/18/2021   MCV 88.7 04/17/2021   PLT 75 (L) 04/26/2021     Assessment/Plan:  Day of Surgery   - Expected postop acute blood loss anemia - will monitor for symptoms -Nonweightbearing on left upper extremity in Ex-Fix -We will continue to perform ongoing close neurovascular checks -Plan for return to operating room for definitive fixation pending time to allow for swelling to resolve.  Hopefully this will allow for primary closure of his fasciotomies -Given the quality of his debridement and minimal contamination, no additional recommendation for standing antibiotics at this time -Pending plan for return to operating room for left arm for open reduction internal fixation of left both bone forearm fracture and fasciotomy closure left arm with removal of external fixator   Danyela Posas 04/10/2021, 8:18 PM

## 2021-04-25 NOTE — Procedures (Signed)
   Procedure Note  Date: 04/22/2021  Procedure: central venous catheter placement--right, femoral vein, without ultrasound guidance  Pre-op diagnosis: inadequate IV access, unable to obtain additional peripheral access Post-op diagnosis: same  Surgeon: Diamantina Monks, MD  Anesthesia: local  EBL: <5cc Drains/Implants: triple lumen central venous catheter  Description of procedure: Time-out was performed verifying correct patient, procedure, site, laterality, and signature of informed consent. The right groin was prepped and draped in the usual sterile fashion. The right femoral vein was localized using anatomic landmarks. The right femoral vein was accessed using an introducer needle and a guidewire passed through the needle. The needle was removed and a skin nick was made. The tract was dilated and the central venous catheter advanced over the guidewire followed by removal of the guidewire. All ports drew blood easily and all were flushed with saline. The catheter was secured to the skin with suture and a sterile dressing. The patient tolerated the procedure well. There were no immediate complications.    Diamantina Monks, MD General and Trauma Surgery [facility] Surgery

## 2021-04-26 ENCOUNTER — Encounter (HOSPITAL_COMMUNITY): Payer: Self-pay | Admitting: Vascular Surgery

## 2021-04-26 ENCOUNTER — Inpatient Hospital Stay: Payer: Self-pay

## 2021-04-26 ENCOUNTER — Inpatient Hospital Stay (HOSPITAL_COMMUNITY): Payer: Medicaid Other

## 2021-04-26 LAB — BASIC METABOLIC PANEL
Anion gap: 4 — ABNORMAL LOW (ref 5–15)
BUN: 6 mg/dL (ref 6–20)
CO2: 23 mmol/L (ref 22–32)
Calcium: 7.5 mg/dL — ABNORMAL LOW (ref 8.9–10.3)
Chloride: 112 mmol/L — ABNORMAL HIGH (ref 98–111)
Creatinine, Ser: 1.18 mg/dL (ref 0.61–1.24)
GFR, Estimated: >60 mL/min (ref >60)
Glucose, Bld: 98 mg/dL (ref 70–99)
Potassium: 3.2 mmol/L — ABNORMAL LOW (ref 3.5–5.1)
Sodium: 139 mmol/L (ref 135–145)

## 2021-04-26 LAB — TYPE AND SCREEN
ABO/RH(D): O POS
Antibody Screen: NEGATIVE
Unit division: 0
Unit division: 0
Unit division: 0
Unit division: 0
Unit division: 0
Unit division: 0

## 2021-04-26 LAB — CBC
HCT: 27.2 % — ABNORMAL LOW (ref 39.0–52.0)
Hemoglobin: 9.4 g/dL — ABNORMAL LOW (ref 13.0–17.0)
MCH: 29.9 pg (ref 26.0–34.0)
MCHC: 34.6 g/dL (ref 30.0–36.0)
MCV: 86.6 fL (ref 80.0–100.0)
Platelets: 63 10*3/uL — ABNORMAL LOW (ref 150–400)
RBC: 3.14 MIL/uL — ABNORMAL LOW (ref 4.22–5.81)
RDW: 15.5 % (ref 11.5–15.5)
WBC: 11.4 10*3/uL — ABNORMAL HIGH (ref 4.0–10.5)
nRBC: 0 % (ref 0.0–0.2)

## 2021-04-26 LAB — BPAM RBC
Blood Product Expiration Date: 202305172359
Blood Product Expiration Date: 202305182359
Blood Product Expiration Date: 202305222359
Blood Product Expiration Date: 202305222359
Blood Product Expiration Date: 202305222359
Blood Product Expiration Date: 202305252359
ISSUE DATE / TIME: 202304240120
ISSUE DATE / TIME: 202304240121
ISSUE DATE / TIME: 202304240130
ISSUE DATE / TIME: 202304240130
ISSUE DATE / TIME: 202304240345
ISSUE DATE / TIME: 202304240345
Unit Type and Rh: 5100
Unit Type and Rh: 5100
Unit Type and Rh: 5100
Unit Type and Rh: 5100
Unit Type and Rh: 5100
Unit Type and Rh: 5100

## 2021-04-26 LAB — PREPARE FRESH FROZEN PLASMA

## 2021-04-26 LAB — BPAM FFP
Blood Product Expiration Date: 202304292359
Blood Product Expiration Date: 202304292359
ISSUE DATE / TIME: 202304240414
ISSUE DATE / TIME: 202304240414
Unit Type and Rh: 5100
Unit Type and Rh: 5100

## 2021-04-26 LAB — GLUCOSE, CAPILLARY
Glucose-Capillary: 109 mg/dL — ABNORMAL HIGH (ref 70–99)
Glucose-Capillary: 87 mg/dL (ref 70–99)
Glucose-Capillary: 105 mg/dL — ABNORMAL HIGH (ref 70–99)
Glucose-Capillary: 125 mg/dL — ABNORMAL HIGH (ref 70–99)
Glucose-Capillary: 123 mg/dL — ABNORMAL HIGH (ref 70–99)
Glucose-Capillary: 118 mg/dL — ABNORMAL HIGH (ref 70–99)

## 2021-04-26 LAB — HIV ANTIBODY (ROUTINE TESTING W REFLEX): HIV Screen 4th Generation wRfx: NONREACTIVE

## 2021-04-26 LAB — TRIGLYCERIDES: Triglycerides: 238 mg/dL — ABNORMAL HIGH (ref <150)

## 2021-04-26 MED ORDER — POTASSIUM CHLORIDE 20 MEQ PO PACK
40.0000 meq | PACK | Freq: Four times a day (QID) | ORAL | Status: AC
  Administered 2021-04-26 (×2): 40 meq
  Filled 2021-04-26: qty 2

## 2021-04-26 MED ORDER — OXYCODONE HCL 5 MG/5ML PO SOLN
5.0000 mg | ORAL | Status: DC | PRN
  Administered 2021-04-26 – 2021-04-27 (×3): 10 mg
  Filled 2021-04-26 (×3): qty 10

## 2021-04-26 MED ORDER — VITAL HIGH PROTEIN PO LIQD: 1000.0000 mL | ORAL | Status: DC

## 2021-04-26 MED ORDER — SODIUM CHLORIDE 0.9% IV SOLUTION: Freq: Once | INTRAVENOUS | Status: DC

## 2021-04-26 MED ORDER — PANTOPRAZOLE 2 MG/ML SUSPENSION
40.0000 mg | Freq: Every day | ORAL | Status: DC
  Administered 2021-04-26 – 2021-04-27 (×2): 40 mg
  Filled 2021-04-26 (×2): qty 20

## 2021-04-26 MED ORDER — PROSOURCE TF PO LIQD
45.0000 mL | Freq: Two times a day (BID) | ORAL | Status: DC
  Administered 2021-04-26: 45 mL
  Filled 2021-04-26: qty 45

## 2021-04-26 MED ORDER — CIPROFLOXACIN-DEXAMETHASONE 0.3-0.1 % OT SUSP
4.0000 [drp] | Freq: Two times a day (BID) | OTIC | Status: DC
  Administered 2021-04-26 – 2021-05-14 (×37): 4 [drp] via OTIC
  Filled 2021-04-26: qty 7.5

## 2021-04-26 MED ORDER — PIVOT 1.5 CAL PO LIQD: 1000.0000 mL | ORAL | Status: DC

## 2021-04-26 MED ORDER — PIVOT 1.5 CAL PO LIQD
1000.0000 mL | ORAL | Status: DC
  Administered 2021-04-26: 1000 mL

## 2021-04-26 NOTE — Progress Notes (Signed)
  Transition of Care [facility]) Screening Note   Patient Details  Name: Nathan West Date of Birth: 1990/12/13   Transition of Care [facility]) CM/SW Contact:    Glennon Mac, RN Phone Number: 04/26/2021, 4:36 PM    Transition of Care Department [facility]) has reviewed patient and no TOC needs have been identified at this time. We will continue to monitor patient advancement through interdisciplinary progression rounds. If new patient transition needs arise, please place a TOC consult.   Quintella Baton, RN, BSN  Trauma/Neuro ICU Case Manager [phone]
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
## 2021-04-26 NOTE — Anesthesia Postprocedure Evaluation (Signed)
Anesthesia Post Note  Patient: Nathan West  Procedure(s) Performed: REPAIR BRACHIAL ARTERY (Left: Arm Upper) EXTERNAL FIXATION ARM (Left)     Patient location during evaluation: SICU Anesthesia Type: General Level of consciousness: sedated Pain management: pain level controlled Vital Signs Assessment: post-procedure vital signs reviewed and stable Respiratory status: patient remains intubated per anesthesia plan Cardiovascular status: stable Postop Assessment: no apparent nausea or vomiting Anesthetic complications: no   No notable events documented.  Last Vitals:  Vitals:   04/26/21 1100 04/26/21 1110  BP: 109/65   Pulse: 76 75  Resp: 20 20  Temp: 36.9 C 36.9 C  SpO2: 100% 100%    Last Pain:  Vitals:   05/01/2021 2205  TempSrc: Axillary                 Hodan Wurtz

## 2021-04-26 NOTE — Anesthesia Postprocedure Evaluation (Signed)
Anesthesia Post Note  Patient: Nathan West  Procedure(s) Performed: REPAIR BRACHIAL ARTERY (Left: Arm Upper) EXTERNAL FIXATION ARM (Left)     Patient location during evaluation: SICU Anesthesia Type: General Level of consciousness: sedated Pain management: pain level controlled Vital Signs Assessment: post-procedure vital signs reviewed and stable Respiratory status: patient remains intubated per anesthesia plan Cardiovascular status: stable Postop Assessment: no apparent nausea or vomiting Anesthetic complications: no   No notable events documented.  Last Vitals:  Vitals:   04/26/21 1100 04/26/21 1110  BP: 109/65   Pulse: 76 75  Resp: 20 20  Temp: 36.9 C 36.9 C  SpO2: 100% 100%    Last Pain:  Vitals:   05/01/2021 2205  TempSrc: Axillary                 Zakhari Fogel     
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
## 2021-04-27 ENCOUNTER — Other Ambulatory Visit: Payer: Self-pay

## 2021-04-27 ENCOUNTER — Inpatient Hospital Stay (HOSPITAL_COMMUNITY): Payer: Medicaid Other | Admitting: Certified Registered Nurse Anesthetist

## 2021-04-27 ENCOUNTER — Encounter (HOSPITAL_COMMUNITY): Admission: EM | Disposition: E | Payer: Self-pay | Source: Home / Self Care

## 2021-04-27 ENCOUNTER — Inpatient Hospital Stay (HOSPITAL_COMMUNITY): Payer: Medicaid Other

## 2021-04-27 DIAGNOSIS — M7989 Other specified soft tissue disorders: Secondary | ICD-10-CM

## 2021-04-27 DIAGNOSIS — S52202F Unspecified fracture of shaft of left ulna, subsequent encounter for open fracture type IIIA, IIIB, or IIIC with routine healing: Secondary | ICD-10-CM

## 2021-04-27 DIAGNOSIS — S5292XB Unspecified fracture of left forearm, initial encounter for open fracture type I or II: Secondary | ICD-10-CM

## 2021-04-27 DIAGNOSIS — S5292XF Unspecified fracture of left forearm, subsequent encounter for open fracture type IIIA, IIIB, or IIIC with routine healing: Secondary | ICD-10-CM

## 2021-04-27 DIAGNOSIS — S52202B Unspecified fracture of shaft of left ulna, initial encounter for open fracture type I or II: Secondary | ICD-10-CM

## 2021-04-27 DIAGNOSIS — D649 Anemia, unspecified: Secondary | ICD-10-CM

## 2021-04-27 HISTORY — PX: ORIF WRIST FRACTURE: SHX2133

## 2021-04-27 HISTORY — PX: EXTERNAL FIXATION ARM: SHX1552

## 2021-04-27 LAB — BASIC METABOLIC PANEL
Anion gap: 4 — ABNORMAL LOW (ref 5–15)
BUN: 7 mg/dL (ref 6–20)
CO2: 23 mmol/L (ref 22–32)
Calcium: 7.7 mg/dL — ABNORMAL LOW (ref 8.9–10.3)
Chloride: 112 mmol/L — ABNORMAL HIGH (ref 98–111)
Creatinine, Ser: 1.14 mg/dL (ref 0.61–1.24)
GFR, Estimated: >60 mL/min (ref >60)
Glucose, Bld: 107 mg/dL — ABNORMAL HIGH (ref 70–99)
Potassium: 3.6 mmol/L (ref 3.5–5.1)
Sodium: 139 mmol/L (ref 135–145)

## 2021-04-27 LAB — POCT I-STAT 7, (LYTES, BLD GAS, ICA,H+H)
Acid-base deficit: 3 mmol/L — ABNORMAL HIGH (ref 0.0–2.0)
Bicarbonate: 21.5 mmol/L (ref 20.0–28.0)
Calcium, Ion: 1.1 mmol/L — ABNORMAL LOW (ref 1.15–1.40)
HCT: 22 % — ABNORMAL LOW (ref 39.0–52.0)
Hemoglobin: 7.5 g/dL — ABNORMAL LOW (ref 13.0–17.0)
O2 Saturation: 98 %
Patient temperature: 36.8
Potassium: 3.8 mmol/L (ref 3.5–5.1)
Sodium: 141 mmol/L (ref 135–145)
TCO2: 23 mmol/L (ref 22–32)
pCO2 arterial: 35.6 mmHg (ref 32–48)
pH, Arterial: 7.387 (ref 7.35–7.45)
pO2, Arterial: 100 mmHg (ref 83–108)

## 2021-04-27 LAB — CBC
HCT: 26.2 % — ABNORMAL LOW (ref 39.0–52.0)
Hemoglobin: 8.8 g/dL — ABNORMAL LOW (ref 13.0–17.0)
MCH: 30 pg (ref 26.0–34.0)
MCHC: 33.6 g/dL (ref 30.0–36.0)
MCV: 89.4 fL (ref 80.0–100.0)
Platelets: 102 10*3/uL — ABNORMAL LOW (ref 150–400)
RBC: 2.93 MIL/uL — ABNORMAL LOW (ref 4.22–5.81)
RDW: 15.6 % — ABNORMAL HIGH (ref 11.5–15.5)
WBC: 11.6 10*3/uL — ABNORMAL HIGH (ref 4.0–10.5)
nRBC: 0 % (ref 0.0–0.2)

## 2021-04-27 LAB — GLUCOSE, CAPILLARY
Glucose-Capillary: 125 mg/dL — ABNORMAL HIGH (ref 70–99)
Glucose-Capillary: 98 mg/dL (ref 70–99)
Glucose-Capillary: 102 mg/dL — ABNORMAL HIGH (ref 70–99)
Glucose-Capillary: 118 mg/dL — ABNORMAL HIGH (ref 70–99)
Glucose-Capillary: 133 mg/dL — ABNORMAL HIGH (ref 70–99)
Glucose-Capillary: 122 mg/dL — ABNORMAL HIGH (ref 70–99)

## 2021-04-27 SURGERY — OPEN REDUCTION INTERNAL FIXATION (ORIF) WRIST FRACTURE
Anesthesia: General | Site: Wrist | Laterality: Left

## 2021-04-27 MED ORDER — ONDANSETRON HCL 4 MG/2ML IJ SOLN
INTRAMUSCULAR | Status: AC
  Filled 2021-04-27: qty 2

## 2021-04-27 MED ORDER — MIDAZOLAM HCL 2 MG/2ML IJ SOLN
INTRAMUSCULAR | Status: AC
  Filled 2021-04-27: qty 2

## 2021-04-27 MED ORDER — PROPOFOL 10 MG/ML IV BOLUS
INTRAVENOUS | Status: AC
Start: 2021-04-27 — End: ?
  Filled 2021-04-27: qty 20

## 2021-04-27 MED ORDER — ROCURONIUM BROMIDE 10 MG/ML (PF) SYRINGE
PREFILLED_SYRINGE | INTRAVENOUS | Status: DC | PRN
  Administered 2021-04-27: 50 mg via INTRAVENOUS

## 2021-04-27 MED ORDER — CHLORHEXIDINE GLUCONATE 4 % EX LIQD
60.0000 mL | Freq: Once | CUTANEOUS | Status: DC
  Filled 2021-04-27: qty 60

## 2021-04-27 MED ORDER — CEFAZOLIN SODIUM-DEXTROSE 2-4 GM/100ML-% IV SOLN
2.0000 g | INTRAVENOUS | Status: AC
  Administered 2021-04-27: 2 g via INTRAVENOUS

## 2021-04-27 MED ORDER — HYDROMORPHONE HCL 1 MG/ML IJ SOLN
INTRAMUSCULAR | Status: DC | PRN
  Administered 2021-04-27: .5 mg via INTRAVENOUS

## 2021-04-27 MED ORDER — 0.9 % SODIUM CHLORIDE (POUR BTL) OPTIME
TOPICAL | Status: DC | PRN
  Administered 2021-04-27: 1000 mL

## 2021-04-27 MED ORDER — FENTANYL CITRATE (PF) 250 MCG/5ML IJ SOLN
INTRAMUSCULAR | Status: DC | PRN
  Administered 2021-04-27 (×2): 100 ug via INTRAVENOUS
  Administered 2021-04-27: 50 ug via INTRAVENOUS

## 2021-04-27 MED ORDER — PROPOFOL 1000 MG/100ML IV EMUL
0.0000 ug/kg/min | INTRAVENOUS | Status: DC
  Administered 2021-04-27 – 2021-04-28 (×3): 50 ug/kg/min via INTRAVENOUS
  Filled 2021-04-27 (×2): qty 100

## 2021-04-27 MED ORDER — SODIUM CHLORIDE 0.9% FLUSH: 10.0000 mL | INTRAVENOUS | Status: DC | PRN

## 2021-04-27 MED ORDER — MIDAZOLAM HCL 2 MG/2ML IJ SOLN
INTRAMUSCULAR | Status: DC | PRN
  Administered 2021-04-27: 2 mg via INTRAVENOUS

## 2021-04-27 MED ORDER — HYDROMORPHONE HCL 1 MG/ML IJ SOLN
INTRAMUSCULAR | Status: AC
  Filled 2021-04-27: qty 0.5

## 2021-04-27 MED ORDER — POTASSIUM CHLORIDE 20 MEQ PO PACK
40.0000 meq | PACK | Freq: Once | ORAL | Status: AC
  Administered 2021-04-27: 40 meq

## 2021-04-27 MED ORDER — SODIUM CHLORIDE 0.9 % IR SOLN
Status: DC | PRN
  Administered 2021-04-27: 3000 mL

## 2021-04-27 MED ORDER — TRANEXAMIC ACID-NACL 1000-0.7 MG/100ML-% IV SOLN: 1000.0000 mg | INTRAVENOUS | Status: DC

## 2021-04-27 MED ORDER — ENOXAPARIN SODIUM 100 MG/ML IJ SOSY
95.0000 mg | PREFILLED_SYRINGE | Freq: Two times a day (BID) | INTRAMUSCULAR | Status: DC
  Administered 2021-04-28 – 2021-05-03 (×12): 95 mg via SUBCUTANEOUS
  Filled 2021-04-27: qty 1
  Filled 2021-04-27: qty 0.95
  Filled 2021-04-27 (×4): qty 1
  Filled 2021-04-27: qty 0.95
  Filled 2021-04-27 (×3): qty 1
  Filled 2021-04-27 (×3): qty 0.95
  Filled 2021-04-27: qty 1

## 2021-04-27 MED ORDER — LACTATED RINGERS IV SOLN: INTRAVENOUS | Status: DC | PRN

## 2021-04-27 MED ORDER — FENTANYL CITRATE (PF) 250 MCG/5ML IJ SOLN
INTRAMUSCULAR | Status: AC
  Filled 2021-04-27: qty 5

## 2021-04-27 MED ORDER — MIDAZOLAM HCL 2 MG/2ML IJ SOLN
2.0000 mg | INTRAMUSCULAR | Status: DC | PRN
Start: 2021-04-27 — End: 2021-04-28
  Administered 2021-04-27 – 2021-04-28 (×3): 2 mg via INTRAVENOUS
  Filled 2021-04-27 (×2): qty 2

## 2021-04-27 MED ORDER — SODIUM CHLORIDE 0.9% FLUSH
10.0000 mL | Freq: Two times a day (BID) | INTRAVENOUS | Status: DC
  Administered 2021-04-27: 10 mL
  Administered 2021-04-27: 30 mL
  Administered 2021-04-28 – 2021-05-04 (×13): 10 mL
  Administered 2021-05-04 – 2021-05-05 (×2): 30 mL
  Administered 2021-05-05 – 2021-05-06 (×2): 10 mL
  Administered 2021-05-06: 20 mL
  Administered 2021-05-07 – 2021-05-09 (×5): 10 mL
  Administered 2021-05-10 – 2021-05-11 (×2): 30 mL
  Administered 2021-05-11 – 2021-05-12 (×2): 10 mL
  Administered 2021-05-12: 30 mL
  Administered 2021-05-13 (×2): 10 mL
  Administered 2021-05-14: 30 mL
  Administered 2021-05-14: 10 mL
  Administered 2021-05-15: 30 mL
  Administered 2021-05-15 – 2021-05-24 (×18): 10 mL
  Administered 2021-05-25: 20 mL
  Administered 2021-05-25 – 2021-05-26 (×2): 10 mL
  Administered 2021-05-26 – 2021-05-27 (×3): 20 mL
  Administered 2021-05-28: 30 mL
  Administered 2021-05-28: 10 mL
  Administered 2021-05-29: 30 mL
  Administered 2021-05-31 – 2021-06-03 (×8): 10 mL
  Administered 2021-06-04: 40 mL
  Administered 2021-06-04: 10 mL
  Administered 2021-06-05: 40 mL
  Administered 2021-06-05 – 2021-06-08 (×7): 10 mL
  Administered 2021-06-09: 20 mL
  Administered 2021-06-09 – 2021-06-15 (×10): 10 mL

## 2021-04-27 MED ORDER — ENOXAPARIN SODIUM 30 MG/0.3ML IJ SOSY: 30.0000 mg | PREFILLED_SYRINGE | Freq: Two times a day (BID) | INTRAMUSCULAR | Status: DC

## 2021-04-27 SURGICAL SUPPLY — 82 items
BAG COUNTER SPONGE SURGICOUNT (BAG) ×4 IMPLANT
BAG SPNG CNTER NS LX DISP (BAG) ×2
BIT DRILL QC 2.0 SHORT EVOS SM (DRILL) IMPLANT
BIT DRILL QC 2.5MM SHRT EVO SM (DRILL) IMPLANT
BNDG CMPR 9X4 STRL LF SNTH (GAUZE/BANDAGES/DRESSINGS) ×2
BNDG COHESIVE 4X5 TAN STRL (GAUZE/BANDAGES/DRESSINGS) ×4 IMPLANT
BNDG ELASTIC 3X5.8 VLCR STR LF (GAUZE/BANDAGES/DRESSINGS) ×2 IMPLANT
BNDG ELASTIC 4X5.8 VLCR STR LF (GAUZE/BANDAGES/DRESSINGS) ×2 IMPLANT
BNDG ESMARK 4X9 LF (GAUZE/BANDAGES/DRESSINGS) ×4 IMPLANT
BNDG GAUZE ELAST 4 BULKY (GAUZE/BANDAGES/DRESSINGS) ×4 IMPLANT
COVER SURGICAL LIGHT HANDLE (MISCELLANEOUS) ×8 IMPLANT
CUFF TOURN SGL QUICK 18X4 (TOURNIQUET CUFF) ×4 IMPLANT
CUFF TOURN SGL QUICK 24 (TOURNIQUET CUFF)
CUFF TRNQT CYL 24X4X16.5-23 (TOURNIQUET CUFF) IMPLANT
DRAPE C-ARMOR (DRAPES) ×1 IMPLANT
DRAPE INCISE IOBAN 66X45 STRL (DRAPES) IMPLANT
DRAPE OEC MINIVIEW 54X84 (DRAPES) ×4 IMPLANT
DRAPE U-SHAPE 47X51 STRL (DRAPES) ×4 IMPLANT
DRILL QC 2.0 SHORT EVOS SM (DRILL) ×3
DRILL QC 2.5MM SHORT EVOS SM (DRILL) ×3
DRSG EMULSION OIL 3X3 NADH (GAUZE/BANDAGES/DRESSINGS) IMPLANT
DRSG PAD ABDOMINAL 8X10 ST (GAUZE/BANDAGES/DRESSINGS) IMPLANT
DURAPREP 26ML APPLICATOR (WOUND CARE) ×4 IMPLANT
ELECT REM PT RETURN 9FT ADLT (ELECTROSURGICAL) ×3
ELECTRODE REM PT RTRN 9FT ADLT (ELECTROSURGICAL) ×3 IMPLANT
GAUZE 4X4 16PLY ~~LOC~~+RFID DBL (SPONGE) ×5 IMPLANT
GAUZE SPONGE 4X4 12PLY STRL (GAUZE/BANDAGES/DRESSINGS) IMPLANT
GAUZE XEROFORM 5X9 LF (GAUZE/BANDAGES/DRESSINGS) ×4 IMPLANT
GLOVE BIOGEL PI IND STRL 9 (GLOVE) ×3 IMPLANT
GLOVE BIOGEL PI INDICATOR 9 (GLOVE) ×4
GLOVE SURG ORTHO 9.0 STRL STRW (GLOVE) ×7 IMPLANT
GOWN STRL REUS W/ TWL XL LVL3 (GOWN DISPOSABLE) ×6 IMPLANT
GOWN STRL REUS W/TWL XL LVL3 (GOWN DISPOSABLE) ×15
K-WIRE 1.6 (WIRE) ×6
K-WIRE FX150X1.6XTROC PNT (WIRE) ×4
KIT BASIN OR (CUSTOM PROCEDURE TRAY) ×4 IMPLANT
KIT TURNOVER KIT B (KITS) ×4 IMPLANT
KWIRE FX150X1.6XTROC PNT (WIRE) IMPLANT
MANIFOLD NEPTUNE II (INSTRUMENTS) ×4 IMPLANT
NS IRRIG 1000ML POUR BTL (IV SOLUTION) ×5 IMPLANT
PACK ORTHO EXTREMITY (CUSTOM PROCEDURE TRAY) ×4 IMPLANT
PAD ARMBOARD 7.5X6 YLW CONV (MISCELLANEOUS) ×8 IMPLANT
PAD CAST 3X4 CTTN HI CHSV (CAST SUPPLIES) IMPLANT
PAD CAST 4YDX4 CTTN HI CHSV (CAST SUPPLIES) IMPLANT
PADDING CAST COTTON 3X4 STRL (CAST SUPPLIES) ×15
PADDING CAST COTTON 4X4 STRL (CAST SUPPLIES) ×12
PLATE EVOS 3.5 LCK 12H COMPRE (Plate) ×1 IMPLANT
PLATE EVOS PROX RAD SHAFT 8H L (Plate) ×1 IMPLANT
SCREW CORT 2.7X18 T8 ST EVOS (Screw) ×2 IMPLANT
SCREW CORT 3.5X14 ST EVOS (Screw) ×2 IMPLANT
SCREW CORT 3.5X16 ST EVOS (Screw) ×3 IMPLANT
SCREW CORT 3.5X20 ST EVOS (Screw) ×3 IMPLANT
SCREW CORT 3.5X22 ST EVOS (Screw) ×1 IMPLANT
SCREW CORT 3.5X30 ST EVOS (Screw) ×1 IMPLANT
SCREW CORT 3.5X32 ST EVOS (Screw) ×1 IMPLANT
SCREW CORT VA EVOS 2.7X26 (Screw) ×1 IMPLANT
SCREW CORTEX 3.5X18 EVOS (Screw) ×3 IMPLANT
SCREW CTX 3.5X34MM EVOS (Screw) ×1 IMPLANT
SCREW LOCK ST EVOS 2.7X20 (Screw) ×1 IMPLANT
SCREW LOCK ST EVOS 2.7X22 (Screw) ×1 IMPLANT
SET TUBING IRRIGATION DISP (TUBING) ×1 IMPLANT
SPONGE T-LAP 18X18 ~~LOC~~+RFID (SPONGE) ×8 IMPLANT
STAPLER VISISTAT 35W (STAPLE) ×4 IMPLANT
STRIP CLOSURE SKIN 1/2X4 (GAUZE/BANDAGES/DRESSINGS) ×4 IMPLANT
SUCTION FRAZIER HANDLE 10FR (MISCELLANEOUS) ×3
SUCTION TUBE FRAZIER 10FR DISP (MISCELLANEOUS) ×3 IMPLANT
SUT ETHILON 3 0 PS 1 (SUTURE) ×7 IMPLANT
SUT ETHILON O TP 1 (SUTURE) ×1 IMPLANT
SUT PROLENE 3 0 PS 1 (SUTURE) ×4 IMPLANT
SUT VIC AB 0 CT1 27 (SUTURE) ×15
SUT VIC AB 0 CT1 27XBRD ANBCTR (SUTURE) IMPLANT
SUT VIC AB 0 CT1 36 (SUTURE) ×2 IMPLANT
SUT VIC AB 2-0 CT1 18 (SUTURE) ×1 IMPLANT
SUT VIC AB 2-0 CTB1 (SUTURE) IMPLANT
SUT VIC AB 3-0 X1 27 (SUTURE) ×4 IMPLANT
SUT VICRYL 4-0 PS2 18IN ABS (SUTURE) IMPLANT
SYR BULB EAR ULCER 3OZ GRN STR (SYRINGE) ×4 IMPLANT
TOWEL GREEN STERILE (TOWEL DISPOSABLE) ×5 IMPLANT
TOWEL GREEN STERILE FF (TOWEL DISPOSABLE) ×4 IMPLANT
TUBE CONNECTING 12X1/4 (SUCTIONS) ×4 IMPLANT
WATER STERILE IRR 1000ML POUR (IV SOLUTION) ×4 IMPLANT
YANKAUER SUCT BULB TIP NO VENT (SUCTIONS) ×4 IMPLANT
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
## 2021-04-27 NOTE — Progress Notes (Signed)
Peripherally Inserted Central Catheter Placement  The IV Nurse has discussed with the patient and/or persons authorized to consent for the patient, the purpose of this procedure and the potential benefits and risks involved with this procedure.  The benefits include less needle sticks, lab draws from the catheter, and the patient may be discharged home with the catheter. Risks include, but not limited to, infection, bleeding, blood clot (thrombus formation), and puncture of an artery; nerve damage and irregular heartbeat and possibility to perform a PICC exchange if needed/ordered by physician.  Alternatives to this procedure were also discussed.  Bard Power PICC patient education guide, fact sheet on infection prevention and patient information card has been provided to patient /or left at bedside.    Consent obtained via telephone consent with mother.  PICC Placement Documentation  PICC Triple Lumen 04/03/2021 Right Cephalic 40 cm 0 cm (Active)  Indication for Insertion or Continuance of Line Prolonged intravenous therapies 04/21/2021 1200  Exposed Catheter (cm) 0 cm 04/21/2021 1200  Site Assessment Clean, Dry, Intact 04/26/2021 1200  Lumen #1 Status Flushed;Saline locked;Blood return noted 04/14/2021 1200  Lumen #2 Status Flushed;Saline locked;Blood return noted 04/22/2021 1200  Lumen #3 Status Flushed;Saline locked;Blood return noted 04/04/2021 1200  Dressing Type Securing device;Transparent 04/04/2021 1200  Dressing Status Antimicrobial disc in place 04/03/2021 1200  Safety Lock Not Applicable 04/14/2021 1200  Line Care Connections checked and tightened 04/24/2021 1200  Line Adjustment (NICU/IV Team Only) No 04/28/2021 1200  Dressing Intervention New dressing 04/03/2021 1200  Dressing Change Due 05/04/21 04/18/2021 1200       Vernona Rieger  Martha Ellerby 04/26/2021, 12:07 PM
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
## 2021-04-27 NOTE — Progress Notes (Signed)
Trauma Event Note    TRN at bedside to round. Patient intubated, alert and follows commands. Primary nurse at bedside during rounds, no needs for TRN at this time.  Last imported Vital Signs BP (!) 101/57   Pulse 91   Temp (!) 100.8 F (38.2 C) (Axillary)   Resp 20   SpO2 100%   Trending CBC Recent Labs    04/21/2021 0549 04/24/2021 0559 04/26/21 0952  WBC 15.4*  --  11.4*  HGB 12.5* 11.2* 9.4*  HCT 36.0* 33.0* 27.2*  PLT 75*  --  63*    Trending Coag's No results for input(s): APTT, INR in the last 72 hours.  Trending BMET Recent Labs    04/02/2021 0207 04/05/2021 0339 04/09/2021 0559 04/26/21 0952  NA 137 139 141 139  K 5.3* 4.5 4.1 3.2*  CL 108  --   --  112*  CO2 16*  --   --  23  BUN 9  --   --  6  CREATININE 1.60*  --   --  1.18  GLUCOSE 368*  --   --  98      Nathan West O Eutha Cude  Trauma Response RN  Please call TRN at [phone] for further assistance.

## 2021-04-27 NOTE — Progress Notes (Signed)
Right upper extremity venous duplex has been completed. Preliminary results can be found in CV Proc through chart review.  Results were given to the patient's nurse, Amy.  04/02/2021 3:20 PM Olen Cordial RVT
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
## 2021-04-27 NOTE — Transfer of Care (Signed)
2Immediate Anesthesia Transfer of Care Note  Patient: Nathan West  Procedure(s) Performed: OPEN REDUCTION INTERNAL FIXATION (ORIF) BOTH BONE FOREARM FRACTURE (Left: Wrist) EXTERNAL FIXATION REMOVAL (Left: Arm Lower)  Patient Location: ICU  Anesthesia Type:General  Level of Consciousness: Patient remains intubated per anesthesia plan  Airway & Oxygen Therapy: Patient remains intubated per anesthesia plan and Patient placed on Ventilator (see vital sign flow sheet for setting)  Post-op Assessment: Report given to RN and Post -op Vital signs reviewed and stable  Post vital signs: Reviewed and stable  Last Vitals:  Vitals Value Taken Time  BP    Temp    Pulse 95 04/09/2021 2042  Resp 16 04/13/2021 2042  SpO2 100 % 04/03/2021 2042  Vitals shown include unvalidated device data.  Last Pain:  Vitals:   04/24/2021 2000  TempSrc: Oral         Complications: No notable events documented.
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
## 2021-04-28 ENCOUNTER — Inpatient Hospital Stay (HOSPITAL_COMMUNITY): Payer: Medicaid Other

## 2021-04-28 LAB — CBC
HCT: 22.8 % — ABNORMAL LOW (ref 39.0–52.0)
Hemoglobin: 7.9 g/dL — ABNORMAL LOW (ref 13.0–17.0)
MCH: 31 pg (ref 26.0–34.0)
MCHC: 34.6 g/dL (ref 30.0–36.0)
MCV: 89.4 fL (ref 80.0–100.0)
Platelets: 100 10*3/uL — ABNORMAL LOW (ref 150–400)
RBC: 2.55 MIL/uL — ABNORMAL LOW (ref 4.22–5.81)
RDW: 15.3 % (ref 11.5–15.5)
WBC: 9.9 10*3/uL (ref 4.0–10.5)
nRBC: 0 % (ref 0.0–0.2)

## 2021-04-28 LAB — GLUCOSE, CAPILLARY
Glucose-Capillary: 97 mg/dL (ref 70–99)
Glucose-Capillary: 155 mg/dL — ABNORMAL HIGH (ref 70–99)

## 2021-04-28 LAB — PHOSPHORUS: Phosphorus: 3 mg/dL (ref 2.5–4.6)

## 2021-04-28 LAB — BASIC METABOLIC PANEL
Anion gap: 5 (ref 5–15)
BUN: 5 mg/dL — ABNORMAL LOW (ref 6–20)
CO2: 22 mmol/L (ref 22–32)
Calcium: 7.6 mg/dL — ABNORMAL LOW (ref 8.9–10.3)
Chloride: 112 mmol/L — ABNORMAL HIGH (ref 98–111)
Creatinine, Ser: 1.03 mg/dL (ref 0.61–1.24)
GFR, Estimated: >60 mL/min (ref >60)
Glucose, Bld: 105 mg/dL — ABNORMAL HIGH (ref 70–99)
Potassium: 3.4 mmol/L — ABNORMAL LOW (ref 3.5–5.1)
Sodium: 139 mmol/L (ref 135–145)

## 2021-04-28 LAB — PREPARE PLATELET PHERESIS
Unit division: 0
Unit division: 0

## 2021-04-28 LAB — BPAM PLATELET PHERESIS
Blood Product Expiration Date: 202304262359
Blood Product Expiration Date: 202304272359
ISSUE DATE / TIME: 202304251438
Unit Type and Rh: 5100
Unit Type and Rh: 5100

## 2021-04-28 LAB — TRIGLYCERIDES: Triglycerides: 398 mg/dL — ABNORMAL HIGH (ref <150)

## 2021-04-28 LAB — MAGNESIUM: Magnesium: 1.7 mg/dL (ref 1.7–2.4)

## 2021-04-28 MED ORDER — DEXMEDETOMIDINE HCL IN NACL 400 MCG/100ML IV SOLN
0.4000 ug/kg/h | INTRAVENOUS | Status: DC
  Administered 2021-04-28: 0.4 ug/kg/h via INTRAVENOUS
  Administered 2021-04-28: 0.6 ug/kg/h via INTRAVENOUS
  Filled 2021-04-28: qty 100

## 2021-04-28 MED ORDER — DEXMEDETOMIDINE HCL IN NACL 400 MCG/100ML IV SOLN
INTRAVENOUS | Status: AC
Start: 2021-04-28 — End: 2021-04-28
  Filled 2021-04-28: qty 100

## 2021-04-28 MED ORDER — POLYETHYLENE GLYCOL 3350 17 G PO PACK
17.0000 g | PACK | Freq: Every day | ORAL | Status: DC
  Administered 2021-04-28 – 2021-05-01 (×4): 17 g via ORAL
  Filled 2021-04-28 (×4): qty 1

## 2021-04-28 MED ORDER — POTASSIUM CHLORIDE CRYS ER 20 MEQ PO TBCR
40.0000 meq | EXTENDED_RELEASE_TABLET | Freq: Once | ORAL | Status: AC
  Administered 2021-04-28: 40 meq via ORAL
  Filled 2021-04-28: qty 2

## 2021-04-28 MED ORDER — ACETAMINOPHEN 500 MG PO TABS
1000.0000 mg | ORAL_TABLET | Freq: Four times a day (QID) | ORAL | Status: DC
  Administered 2021-04-28 – 2021-05-02 (×15): 1000 mg via ORAL
  Filled 2021-04-28 (×18): qty 2

## 2021-04-28 MED ORDER — OXYCODONE HCL 5 MG PO TABS
5.0000 mg | ORAL_TABLET | ORAL | Status: DC | PRN
  Administered 2021-04-28 – 2021-05-02 (×16): 10 mg via ORAL
  Filled 2021-04-28 (×17): qty 2

## 2021-04-28 MED ORDER — DOCUSATE SODIUM 100 MG PO CAPS
100.0000 mg | ORAL_CAPSULE | Freq: Two times a day (BID) | ORAL | Status: DC
  Administered 2021-04-28 – 2021-05-02 (×10): 100 mg via ORAL
  Filled 2021-04-28 (×10): qty 1

## 2021-04-28 MED ORDER — POTASSIUM CHLORIDE 20 MEQ PO PACK
40.0000 meq | PACK | Freq: Four times a day (QID) | ORAL | Status: DC
  Administered 2021-04-28: 40 meq
  Filled 2021-04-28: qty 2

## 2021-04-28 MED ORDER — MORPHINE SULFATE (PF) 4 MG/ML IV SOLN
4.0000 mg | INTRAVENOUS | Status: DC | PRN
  Administered 2021-04-28 – 2021-04-29 (×5): 4 mg via INTRAVENOUS
  Filled 2021-04-28 (×5): qty 1

## 2021-04-28 MED ORDER — BUSPIRONE HCL 10 MG PO TABS
10.0000 mg | ORAL_TABLET | Freq: Three times a day (TID) | ORAL | Status: DC
  Administered 2021-04-28 – 2021-05-02 (×13): 10 mg via ORAL
  Filled 2021-04-28 (×2): qty 2
  Filled 2021-04-28: qty 1
  Filled 2021-04-28 (×7): qty 2
  Filled 2021-04-28: qty 1
  Filled 2021-04-28 (×4): qty 2

## 2021-04-28 MED ORDER — METHOCARBAMOL 500 MG PO TABS
1000.0000 mg | ORAL_TABLET | Freq: Three times a day (TID) | ORAL | Status: DC
Start: 2021-04-28 — End: 2021-05-03
  Administered 2021-04-28 – 2021-05-02 (×13): 1000 mg via ORAL
  Filled 2021-04-28 (×15): qty 2
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
## 2021-04-28 NOTE — Anesthesia Postprocedure Evaluation (Signed)
Anesthesia Post Note  Patient: Nathan West  Procedure(s) Performed: OPEN REDUCTION INTERNAL FIXATION (ORIF) BOTH BONE FOREARM FRACTURE (Left: Wrist) EXTERNAL FIXATION REMOVAL (Left: Arm Lower)     Patient location during evaluation: SICU Anesthesia Type: General Level of consciousness: sedated and patient remains intubated per anesthesia plan Pain management: pain level controlled Vital Signs Assessment: post-procedure vital signs reviewed and stable Respiratory status: patient on ventilator - see flowsheet for VS and patient remains intubated per anesthesia plan Cardiovascular status: stable Anesthetic complications: no   No notable events documented.  Last Vitals:  Vitals:   04/28/21 1832 04/28/21 2000  BP: (!) 142/82 135/71  Pulse: (!) 133 (!) 119  Resp: 18   Temp: 37.2 C 37.6 C  SpO2: 99%     Last Pain:  Vitals:   04/28/21 2044  TempSrc:   PainSc: 10-Worst pain ever                 Nolon Nations

## 2021-04-28 NOTE — Progress Notes (Signed)
   Subjective:  Patient is extubated this morning and able to participate in exam.  Somewhat difficult to communicate as his voice is hoarse although is following commands and appears to be alert and oriented.  Pain is controlled about the left forearm.  Objective:   VITALS:   Vitals:   04/28/21 0400 04/28/21 0500 04/28/21 0600 04/28/21 0700  BP: 116/61 122/67 123/67 (!) 200/169  Pulse: 95 82 81 (!) 145  Resp: 18 20 16 15   Temp: 99.7 F (37.6 C)     TempSrc: Axillary     SpO2: 100% 100% 100% 99%  Weight:        Left arm is in a dressing which is clean dry and intact.  2+ radial pulse.  Fingers warm and well-perfused.  He is able to gently flex at all digits.  He he is able to extend his EPL of the left hand.  Compartments are compressible dorsally and volarly.   Lab Results  Component Value Date   WBC 9.9 04/28/2021   HGB 7.9 (L) 04/28/2021   HCT 22.8 (L) 04/28/2021   MCV 89.4 04/28/2021   PLT 100 (L) 04/28/2021     Assessment/Plan:  1 Day Post-Op status post left both bone forearm fracture fixation with fasciotomy closure, removal of Ex-Fix, radial nerve exploration  - Continue close vascular checks and compartment checks - Patient to work with PT/OT to optimize mobilization safely - DVT ppx - SCDs, ambulation, Lovenox for contralateral upper extremity DVT, okay to start from orthopedic perspective - NWB operative extremity, he may begin gentle range of motion about the elbow and wrist/hand as tolerated -Orthopedics will continue to follow  Cydnee Fuquay 04/28/2021, 7:41 AM

## 2021-04-28 NOTE — Procedures (Signed)
Extubation Procedure Note  Patient Details:   Name: JW COVIN DOB: Oct 29, 1990 MRN: [id number]   Airway Documentation:  Airway 7.5 mm (Active)  Secured at (cm) 24 cm 04/28/21 0121  Measured From Lips 04/28/21 0121  Secured Location Right 04/28/21 0121  Secured By [employer] 04/28/21 0121  Tube Holder Repositioned Yes 04/28/21 0121  Prone position No 04/28/21 0121  Cuff Pressure (cm H2O) Green OR 18-26 CmH2O 04/28/21 0121  Site Condition Dry 04/28/21 0121   Vent end date: (not recorded) Vent end time: (not recorded)   Evaluation  O2 sats: stable throughout Complications: No apparent complications Patient did tolerate procedure well. Bilateral Breath Sounds: Clear   Yes Patient oriented to time and place.  Placed on 4 lpm [place].  Tolerating well at this time.  Letitia Caul [facility] 04/28/2021, 6:18 AM
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
## 2021-04-28 NOTE — Progress Notes (Signed)
  Progress Note    04/28/2021 7:42 AM 1 Day Post-Op  Subjective:  extubated this morning   Vitals:   04/28/21 0600 04/28/21 0700  BP: 123/67 (!) 200/169  Pulse: 81 (!) 145  Resp: 16 15  Temp:    SpO2: 100% 99%   Physical Exam: Lungs:  non labored Incisions:  bulky dressing left in place L arm Extremities:  sensation intact L hand; good cap refill fingers of L hand Neurologic: A&O  CBC    Component Value Date/Time   WBC 9.9 04/28/2021 0444   RBC 2.55 (L) 04/28/2021 0444   HGB 7.9 (L) 04/28/2021 0444   HCT 22.8 (L) 04/28/2021 0444   PLT 100 (L) 04/28/2021 0444   MCV 89.4 04/28/2021 0444   MCH 31.0 04/28/2021 0444   MCHC 34.6 04/28/2021 0444   RDW 15.3 04/28/2021 0444    BMET    Component Value Date/Time   NA 139 04/28/2021 0444   K 3.4 (L) 04/28/2021 0444   CL 112 (H) 04/28/2021 0444   CO2 22 04/28/2021 0444   GLUCOSE 105 (H) 04/28/2021 0444   BUN 5 (L) 04/28/2021 0444   CREATININE 1.03 04/28/2021 0444   CALCIUM 7.6 (L) 04/28/2021 0444   GFRNONAA >60 04/28/2021 0444    INR No results found for: INR   Intake/Output Summary (Last 24 hours) at 04/28/2021 0742 Last data filed at 04/28/2021 0700 Gross per 24 hour  Intake 2142.95 ml  Output 4150 ml  Net -2007.05 ml     Assessment/Plan:  31 y.o. male is s/p ligation of L ulnar artery 1 Day Post-Op   L hand warm and well perfused with good cap refill Vascular will continue to follow intermittently   Emilie Rutter, PA-C [facility] [phone] 04/28/2021 7:42 AM

## 2021-04-28 NOTE — Progress Notes (Signed)
Pt extubated per at this time per Dr. Bobbye Morton.  Pt placed on 4 lpm [place] for transition.

## 2021-04-28 NOTE — Discharge Instructions (Signed)
     Discharge Instructions    Attending Surgeon: Huel Cote, MD Office Phone Number: [phone]   Diagnosis and Procedures:    Surgeries Performed: Left forearm fixation  Discharge Plan:   GENERAL INSTRUCTIONS: 1.  Keep your surgical site elevated above your heart for at least 5-7 days or longer to prevent swelling. This will improve your comfort and your overall recovery following surgery.     2. Please call Dr. Serena Croissant office at [phone] with questions Monday-Friday during business hours. If no one answers, please leave a message and someone should get back to the patient within 24 hours. For emergencies please call 911 or proceed to the emergency room.   3. Patient to notify surgical team if experiences any of the following: Bowel/Bladder dysfunction, uncontrolled pain, nerve/muscle weakness, incision with increased drainage or redness, nausea/vomiting and Fever greater than 101.0 F.  Be alert for signs of infection including redness, streaking, odor, fever or chills. Be alert for excessive pain or bleeding and notify your surgeon immediately.  WOUND INSTRUCTIONS:   Leave your dressing/cast/splint in place until your post operative visit.  Keep it clean and dry.  Always keep the incision clean and dry until the staples/sutures are removed. If there is no drainage from the incision you should keep it open to air. If there is drainage from the incision you must keep it covered at all times until the drainage stops  Do not soak in a bath tub, hot tub, pool, lake or other body of water until 21 days after your surgery and your incision is completely dry and healed.  If you have removable sutures (or staples) they must be removed 10-14 days (unless otherwise instructed) from the day of your surgery.     1)  Elevate the extremity as much as possible.  2)  Keep the dressing clean and dry.  3)  Please call us if the dressing becomes wet or dirty.  4)  If you are  experiencing worsening pain or worsening swelling, please call.

## 2021-04-28 NOTE — Progress Notes (Signed)
HR 130s, pt anxious and in 8 out of 10 pain in left arm.  4 mg morphine IV given.  Dr. Bedelia Person notified and buspar 10 mg TID ordered.  1st dose given.

## 2021-04-29 LAB — CBC
HCT: 22.1 % — ABNORMAL LOW (ref 39.0–52.0)
Hemoglobin: 7.5 g/dL — ABNORMAL LOW (ref 13.0–17.0)
MCH: 30.7 pg (ref 26.0–34.0)
MCHC: 33.9 g/dL (ref 30.0–36.0)
MCV: 90.6 fL (ref 80.0–100.0)
Platelets: 151 10*3/uL (ref 150–400)
RBC: 2.44 MIL/uL — ABNORMAL LOW (ref 4.22–5.81)
RDW: 14.8 % (ref 11.5–15.5)
WBC: 12.3 10*3/uL — ABNORMAL HIGH (ref 4.0–10.5)
nRBC: 0 % (ref 0.0–0.2)

## 2021-04-29 LAB — BASIC METABOLIC PANEL
Anion gap: 7 (ref 5–15)
BUN: <5 mg/dL — ABNORMAL LOW (ref 6–20)
CO2: 24 mmol/L (ref 22–32)
Calcium: 7.8 mg/dL — ABNORMAL LOW (ref 8.9–10.3)
Chloride: 105 mmol/L (ref 98–111)
Creatinine, Ser: 0.95 mg/dL (ref 0.61–1.24)
GFR, Estimated: >60 mL/min (ref >60)
Glucose, Bld: 123 mg/dL — ABNORMAL HIGH (ref 70–99)
Potassium: 3.5 mmol/L (ref 3.5–5.1)
Sodium: 136 mmol/L (ref 135–145)

## 2021-04-29 MED ORDER — GABAPENTIN 100 MG PO CAPS
200.0000 mg | ORAL_CAPSULE | Freq: Three times a day (TID) | ORAL | Status: DC
  Administered 2021-04-29 – 2021-05-03 (×13): 200 mg via ORAL
  Filled 2021-04-29 (×14): qty 2

## 2021-04-29 MED ORDER — KETOROLAC TROMETHAMINE 30 MG/ML IJ SOLN
30.0000 mg | Freq: Once | INTRAMUSCULAR | Status: AC
  Administered 2021-04-29: 30 mg via INTRAVENOUS
  Filled 2021-04-29: qty 1

## 2021-04-29 MED ORDER — ADULT MULTIVITAMIN W/MINERALS CH
1.0000 | ORAL_TABLET | Freq: Every day | ORAL | Status: DC
  Administered 2021-04-29 – 2021-05-02 (×4): 1 via ORAL
  Filled 2021-04-29 (×5): qty 1

## 2021-04-29 MED ORDER — ENSURE ENLIVE PO LIQD
237.0000 mL | Freq: Three times a day (TID) | ORAL | Status: DC
  Administered 2021-04-29 – 2021-05-02 (×7): 237 mL via ORAL

## 2021-04-29 MED ORDER — MAGNESIUM SULFATE 50 % IJ SOLN
3.0000 g | Freq: Once | INTRAVENOUS | Status: AC
  Administered 2021-04-29: 3 g via INTRAVENOUS
  Filled 2021-04-29: qty 6

## 2021-04-29 MED ORDER — HYDROMORPHONE HCL 1 MG/ML IJ SOLN
0.5000 mg | INTRAMUSCULAR | Status: DC | PRN
  Administered 2021-04-29 – 2021-05-13 (×20): 0.5 mg via INTRAVENOUS
  Filled 2021-04-29: qty 0.5
  Filled 2021-04-29: qty 1
  Filled 2021-04-29 (×6): qty 0.5
  Filled 2021-04-29: qty 1
  Filled 2021-04-29 (×4): qty 0.5
  Filled 2021-04-29: qty 1
  Filled 2021-04-29 (×3): qty 0.5
  Filled 2021-04-29: qty 1
  Filled 2021-04-29 (×2): qty 0.5

## 2021-04-29 MED ORDER — POTASSIUM CHLORIDE 20 MEQ PO PACK
40.0000 meq | PACK | Freq: Once | ORAL | Status: DC
  Filled 2021-04-29: qty 2
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
## 2021-04-29 NOTE — Progress Notes (Signed)
   Subjective:  Patient more alert this AM, able to discuss more. Had pain overnight which was controlled with medication, pain better this AM.  Objective:   VITALS:   Vitals:   04/28/21 2000 04/28/21 2345 04/29/21 0300 04/29/21 0500  BP: 135/71 (!) 182/75 (!) 141/71   Pulse: (!) 119     Resp:      Temp: 99.6 F (37.6 C) 99.4 F (37.4 C) 99 F (37.2 C)   TempSrc: Axillary Axillary Oral   SpO2:      Weight:    92.1 kg  Height:        Left arm is in a dressing which is clean dry and intact.  2+ radial pulse.  Fingers warm and well-perfused.  He is able to gently flex at all digits.  He he is able to extend his EPL of the left hand.  Compartments are compressible dorsally and volarly.   Lab Results  Component Value Date   WBC 12.3 (H) 04/29/2021   HGB 7.5 (L) 04/29/2021   HCT 22.1 (L) 04/29/2021   MCV 90.6 04/29/2021   PLT 151 04/29/2021     Assessment/Plan:  2 Days Post-Op status post left both bone forearm fracture fixation with fasciotomy closure, removal of Ex-Fix, radial nerve exploration  - Continue close vascular checks and compartment checks - Patient to work with PT/OT to optimize mobilization safely - DVT ppx - SCDs, ambulation, Lovenox for contralateral upper extremity DVT, okay to start from orthopedic perspective - NWB operative extremity, he may begin gentle range of motion about the elbow and wrist/hand as tolerated, no lifting with left arm -Orthopedics will continue to follow  Aarush Stukey 04/29/2021, 7:13 AM
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
## 2021-04-30 ENCOUNTER — Inpatient Hospital Stay (HOSPITAL_COMMUNITY): Payer: Medicaid Other

## 2021-04-30 LAB — CBC
HCT: 23.1 % — ABNORMAL LOW (ref 39.0–52.0)
Hemoglobin: 7.6 g/dL — ABNORMAL LOW (ref 13.0–17.0)
MCH: 29.7 pg (ref 26.0–34.0)
MCHC: 32.9 g/dL (ref 30.0–36.0)
MCV: 90.2 fL (ref 80.0–100.0)
Platelets: 246 10*3/uL (ref 150–400)
RBC: 2.56 MIL/uL — ABNORMAL LOW (ref 4.22–5.81)
RDW: 14.5 % (ref 11.5–15.5)
WBC: 12 10*3/uL — ABNORMAL HIGH (ref 4.0–10.5)
nRBC: 0.2 % (ref 0.0–0.2)

## 2021-04-30 LAB — BASIC METABOLIC PANEL
Anion gap: 10 (ref 5–15)
BUN: 6 mg/dL (ref 6–20)
CO2: 25 mmol/L (ref 22–32)
Calcium: 8.2 mg/dL — ABNORMAL LOW (ref 8.9–10.3)
Chloride: 102 mmol/L (ref 98–111)
Creatinine, Ser: 0.93 mg/dL (ref 0.61–1.24)
GFR, Estimated: >60 mL/min (ref >60)
Glucose, Bld: 129 mg/dL — ABNORMAL HIGH (ref 70–99)
Potassium: 3.6 mmol/L (ref 3.5–5.1)
Sodium: 137 mmol/L (ref 135–145)

## 2021-04-30 LAB — MAGNESIUM: Magnesium: 2.3 mg/dL (ref 1.7–2.4)

## 2021-04-30 MED ORDER — DIPHENHYDRAMINE HCL 50 MG/ML IJ SOLN
50.0000 mg | Freq: Every evening | INTRAMUSCULAR | Status: DC | PRN
  Administered 2021-04-30 – 2021-05-02 (×3): 50 mg via INTRAVENOUS
  Filled 2021-04-30 (×3): qty 1

## 2021-04-30 MED ORDER — POTASSIUM CHLORIDE 20 MEQ PO PACK
40.0000 meq | PACK | Freq: Once | ORAL | Status: AC
  Administered 2021-04-30: 40 meq via ORAL
  Filled 2021-04-30: qty 2

## 2021-04-30 MED ORDER — POTASSIUM CHLORIDE 20 MEQ PO PACK: 40.0000 meq | PACK | Freq: Two times a day (BID) | ORAL | Status: DC
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
## 2021-04-30 NOTE — Progress Notes (Signed)
   Subjective:  Patient anxious this AM, pain controlled.  Worked with PT to mobilize. Objective:   VITALS:   Vitals:   04/29/21 2345 04/30/21 0000 04/30/21 0308 04/30/21 0500  BP: (!) 151/78  131/72   Pulse: (!) 111  (!) 111   Resp: 20 19 18    Temp: 98.7 F (37.1 C)  98.7 F (37.1 C)   TempSrc: Oral  Oral   SpO2: 95%  93%   Weight:    92.1 kg  Height:        Left arm is in a dressing which is clean dry and intact.  2+ radial pulse.  Fingers warm and well-perfused.  He is able to gently flex at all digits.  He he is able to extend his EPL of the left hand.  Compartments are compressible dorsally and volarly.   Lab Results  Component Value Date   WBC 12.3 (H) 04/29/2021   HGB 7.5 (L) 04/29/2021   HCT 22.1 (L) 04/29/2021   MCV 90.6 04/29/2021   PLT 151 04/29/2021     Assessment/Plan:  3 Days Post-Op status post left both bone forearm fracture fixation with fasciotomy closure, removal of Ex-Fix, radial nerve exploration  - Patient to continue to work with PT/OT to optimize mobilization safely - DVT ppx - SCDs, ambulation, Lovenox for contralateral upper extremity DVT, okay to start from orthopedic perspective - NWB operative extremity, he may begin gentle range of motion about the elbow and wrist/hand as tolerated, no lifting with left arm -Orthopedics will continue to follow  Beatric Fulop 04/30/2021, 5:48 AM

## 2021-05-01 ENCOUNTER — Inpatient Hospital Stay (HOSPITAL_COMMUNITY): Payer: Medicaid Other

## 2021-05-01 LAB — BASIC METABOLIC PANEL
Anion gap: 7 (ref 5–15)
BUN: <5 mg/dL — ABNORMAL LOW (ref 6–20)
CO2: 25 mmol/L (ref 22–32)
Calcium: 8.1 mg/dL — ABNORMAL LOW (ref 8.9–10.3)
Chloride: 101 mmol/L (ref 98–111)
Creatinine, Ser: 0.84 mg/dL (ref 0.61–1.24)
GFR, Estimated: >60 mL/min (ref >60)
Glucose, Bld: 125 mg/dL — ABNORMAL HIGH (ref 70–99)
Potassium: 3.4 mmol/L — ABNORMAL LOW (ref 3.5–5.1)
Sodium: 133 mmol/L — ABNORMAL LOW (ref 135–145)

## 2021-05-01 LAB — CBC
HCT: 25.4 % — ABNORMAL LOW (ref 39.0–52.0)
Hemoglobin: 8.6 g/dL — ABNORMAL LOW (ref 13.0–17.0)
MCH: 30.4 pg (ref 26.0–34.0)
MCHC: 33.9 g/dL (ref 30.0–36.0)
MCV: 89.8 fL (ref 80.0–100.0)
Platelets: 329 10*3/uL (ref 150–400)
RBC: 2.83 MIL/uL — ABNORMAL LOW (ref 4.22–5.81)
RDW: 14 % (ref 11.5–15.5)
WBC: 12.1 10*3/uL — ABNORMAL HIGH (ref 4.0–10.5)
nRBC: 0.9 % — ABNORMAL HIGH (ref 0.0–0.2)

## 2021-05-01 MED ORDER — QUETIAPINE FUMARATE 25 MG PO TABS
50.0000 mg | ORAL_TABLET | Freq: Every day | ORAL | Status: DC
  Administered 2021-05-01 – 2021-05-02 (×2): 50 mg via ORAL
  Filled 2021-05-01 (×2): qty 1

## 2021-05-01 MED ORDER — METOPROLOL TARTRATE 5 MG/5ML IV SOLN
5.0000 mg | INTRAVENOUS | Status: AC | PRN
  Administered 2021-05-02 (×2): 5 mg via INTRAVENOUS
  Filled 2021-05-01 (×2): qty 5

## 2021-05-01 NOTE — Progress Notes (Signed)
Subjective: 4 Days Post-Op Procedure(s) (LRB): OPEN REDUCTION INTERNAL FIXATION (ORIF) BOTH BONE FOREARM FRACTURE (Left) EXTERNAL FIXATION REMOVAL (Left) Patient reports pain as mild.    Objective: Vital signs in last 24 hours: Temp:  [98.7 F (37.1 C)-99.6 F (37.6 C)] 98.7 F (37.1 C) (04/30 0735) Pulse Rate:  [83-106] 83 (04/30 0735) Resp:  [16-25] 16 (04/30 0735) BP: (133-170)/(69-89) 148/89 (04/30 0735) SpO2:  [94 %-100 %] 97 % (04/30 0735) Weight:  [92 kg] 92 kg (04/30 0500)  Intake/Output from previous day: 04/29 0701 - 04/30 0700 In: 10 [I.V.:10] Out: 400 [Urine:400] Intake/Output this shift: No intake/output data recorded.  Recent Labs    04/29/21 0423 04/30/21 0500 05/01/21 0520  HGB 7.5* 7.6* 8.6*   Recent Labs    04/30/21 0500 05/01/21 0520  WBC 12.0* 12.1*  RBC 2.56* 2.83*  HCT 23.1* 25.4*  PLT 246 329   Recent Labs    04/30/21 0500 05/01/21 0520  NA 137 133*  K 3.6 3.4*  CL 102 101  CO2 25 25  BUN 6 <5*  CREATININE 0.93 0.84  GLUCOSE 129* 125*  CALCIUM 8.2* 8.1*   No results for input(s): LABPT, INR in the last 72 hours.  PHYSICAL EXAM LUE- soft dressing in place.  Patient able to flex and extend all 5 fingers.  Strong radial pulse.  Fingers are warm and well perfused   Assessment/Plan: 4 Days Post-Op Procedure(s) (LRB): OPEN REDUCTION INTERNAL FIXATION (ORIF) BOTH BONE FOREARM FRACTURE (Left) EXTERNAL FIXATION REMOVAL (Left) PLAN LUE-NWB.  Ok for gentle ROM of the elbow/wrist/hand as tolerated.        Cristie Hem 05/01/2021, 9:00 AM
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
## 2021-05-02 ENCOUNTER — Encounter (HOSPITAL_COMMUNITY): Payer: Self-pay | Admitting: Orthopaedic Surgery

## 2021-05-02 ENCOUNTER — Inpatient Hospital Stay (HOSPITAL_COMMUNITY): Payer: Medicaid Other

## 2021-05-02 LAB — BASIC METABOLIC PANEL
Anion gap: 10 (ref 5–15)
Anion gap: 11 (ref 5–15)
BUN: 12 mg/dL (ref 6–20)
BUN: 12 mg/dL (ref 6–20)
CO2: 23 mmol/L (ref 22–32)
CO2: 22 mmol/L (ref 22–32)
Calcium: 8.4 mg/dL — ABNORMAL LOW (ref 8.9–10.3)
Calcium: 8.1 mg/dL — ABNORMAL LOW (ref 8.9–10.3)
Chloride: 98 mmol/L (ref 98–111)
Chloride: 101 mmol/L (ref 98–111)
Creatinine, Ser: 1.23 mg/dL (ref 0.61–1.24)
Creatinine, Ser: 1.24 mg/dL (ref 0.61–1.24)
GFR, Estimated: >60 mL/min (ref >60)
GFR, Estimated: >60 mL/min (ref >60)
Glucose, Bld: 133 mg/dL — ABNORMAL HIGH (ref 70–99)
Glucose, Bld: 134 mg/dL — ABNORMAL HIGH (ref 70–99)
Potassium: 3.6 mmol/L (ref 3.5–5.1)
Potassium: 3.6 mmol/L (ref 3.5–5.1)
Sodium: 131 mmol/L — ABNORMAL LOW (ref 135–145)
Sodium: 134 mmol/L — ABNORMAL LOW (ref 135–145)

## 2021-05-02 LAB — MAGNESIUM: Magnesium: 2.1 mg/dL (ref 1.7–2.4)

## 2021-05-02 LAB — PHOSPHORUS: Phosphorus: 2.6 mg/dL (ref 2.5–4.6)

## 2021-05-02 MED ORDER — SPIRITUS FRUMENTI
1.0000 | Freq: Three times a day (TID) | ORAL | Status: DC
  Administered 2021-05-02: 1 via ORAL
  Filled 2021-05-02 (×5): qty 1

## 2021-05-02 MED ORDER — SODIUM CHLORIDE 0.9 % IV SOLN: INTRAVENOUS | Status: DC

## 2021-05-02 MED ORDER — THIAMINE HCL 100 MG PO TABS
100.0000 mg | ORAL_TABLET | Freq: Every day | ORAL | Status: DC
  Administered 2021-05-02: 100 mg via ORAL
  Filled 2021-05-02: qty 1

## 2021-05-02 MED ORDER — WHITE PETROLATUM EX OINT: TOPICAL_OINTMENT | CUTANEOUS | Status: DC | PRN

## 2021-05-02 MED ORDER — LORAZEPAM 1 MG PO TABS: 1.0000 mg | ORAL_TABLET | ORAL | Status: AC | PRN

## 2021-05-02 MED ORDER — LACTATED RINGERS IV SOLN: INTRAVENOUS | Status: DC

## 2021-05-02 MED ORDER — LIP MEDEX EX OINT
1.0000 "application " | TOPICAL_OINTMENT | CUTANEOUS | Status: DC | PRN
  Administered 2021-05-03 – 2021-05-13 (×4): 1 via TOPICAL
  Filled 2021-05-02: qty 7

## 2021-05-02 MED ORDER — THIAMINE HCL 100 MG/ML IJ SOLN
100.0000 mg | Freq: Every day | INTRAMUSCULAR | Status: DC
  Filled 2021-05-02: qty 2

## 2021-05-02 MED ORDER — FOLIC ACID 1 MG PO TABS
1.0000 mg | ORAL_TABLET | Freq: Every day | ORAL | Status: DC
  Administered 2021-05-02: 1 mg via ORAL
  Filled 2021-05-02 (×2): qty 1

## 2021-05-02 MED ORDER — DEXMEDETOMIDINE HCL IN NACL 400 MCG/100ML IV SOLN
0.0000 ug/kg/h | INTRAVENOUS | Status: DC
  Administered 2021-05-03: 1 ug/kg/h via INTRAVENOUS
  Administered 2021-05-03: 0.9 ug/kg/h via INTRAVENOUS
  Administered 2021-05-03: 0.6 ug/kg/h via INTRAVENOUS
  Administered 2021-05-03: 0.4 ug/kg/h via INTRAVENOUS
  Administered 2021-05-04: 1.5 ug/kg/h via INTRAVENOUS
  Administered 2021-05-04: 1.8 ug/kg/h via INTRAVENOUS
  Administered 2021-05-04: 1.5 ug/kg/h via INTRAVENOUS
  Administered 2021-05-04 (×2): 1.8 ug/kg/h via INTRAVENOUS
  Administered 2021-05-04 (×3): 2 ug/kg/h via INTRAVENOUS
  Administered 2021-05-04: 1.5 ug/kg/h via INTRAVENOUS
  Administered 2021-05-05 (×7): 1.8 ug/kg/h via INTRAVENOUS
  Administered 2021-05-06 – 2021-05-07 (×4): 0.8 ug/kg/h via INTRAVENOUS
  Administered 2021-05-07 (×2): 0.9 ug/kg/h via INTRAVENOUS
  Administered 2021-05-07: 0.6 ug/kg/h via INTRAVENOUS
  Administered 2021-05-07: 0.8 ug/kg/h via INTRAVENOUS
  Administered 2021-05-08 (×3): 0.9 ug/kg/h via INTRAVENOUS
  Administered 2021-05-08: 1 ug/kg/h via INTRAVENOUS
  Administered 2021-05-09: 1.3 ug/kg/h via INTRAVENOUS
  Administered 2021-05-09: 1.2 ug/kg/h via INTRAVENOUS
  Administered 2021-05-09: 1.3 ug/kg/h via INTRAVENOUS
  Administered 2021-05-09: 1.2 ug/kg/h via INTRAVENOUS
  Administered 2021-05-09: 0.9 ug/kg/h via INTRAVENOUS
  Administered 2021-05-10 (×2): 1.7 ug/kg/h via INTRAVENOUS
  Administered 2021-05-10: 1.6 ug/kg/h via INTRAVENOUS
  Administered 2021-05-10: 1.3 ug/kg/h via INTRAVENOUS
  Administered 2021-05-10 (×2): 1.8 ug/kg/h via INTRAVENOUS
  Administered 2021-05-10: 1.3 ug/kg/h via INTRAVENOUS
  Administered 2021-05-10: 1.5 ug/kg/h via INTRAVENOUS
  Administered 2021-05-10: 1.7 ug/kg/h via INTRAVENOUS
  Administered 2021-05-11: 1.4 ug/kg/h via INTRAVENOUS
  Administered 2021-05-11: 1.3 ug/kg/h via INTRAVENOUS
  Administered 2021-05-11: 1.6 ug/kg/h via INTRAVENOUS
  Administered 2021-05-11: 1 ug/kg/h via INTRAVENOUS
  Administered 2021-05-11 – 2021-05-12 (×4): 1.5 ug/kg/h via INTRAVENOUS
  Administered 2021-05-12: 2 ug/kg/h via INTRAVENOUS
  Administered 2021-05-12 (×2): 1.6 ug/kg/h via INTRAVENOUS
  Administered 2021-05-12: 2 ug/kg/h via INTRAVENOUS
  Administered 2021-05-12: 1.8 ug/kg/h via INTRAVENOUS
  Administered 2021-05-12: 1.6 ug/kg/h via INTRAVENOUS
  Administered 2021-05-12: 1.5 ug/kg/h via INTRAVENOUS
  Administered 2021-05-12 (×2): 1.6 ug/kg/h via INTRAVENOUS
  Administered 2021-05-12: 1.5 ug/kg/h via INTRAVENOUS
  Filled 2021-05-02 (×13): qty 100
  Filled 2021-05-02: qty 200
  Filled 2021-05-02 (×16): qty 100
  Filled 2021-05-02: qty 200
  Filled 2021-05-02 (×13): qty 100
  Filled 2021-05-02: qty 200
  Filled 2021-05-02 (×18): qty 100
  Filled 2021-05-02: qty 200
  Filled 2021-05-02 (×2): qty 100

## 2021-05-02 MED ORDER — LIP MEDEX EX OINT: 1.0000 "application " | TOPICAL_OINTMENT | Freq: Once | CUTANEOUS | Status: DC

## 2021-05-02 MED ORDER — CHLORDIAZEPOXIDE HCL 25 MG PO CAPS
25.0000 mg | ORAL_CAPSULE | Freq: Three times a day (TID) | ORAL | Status: DC
  Administered 2021-05-02: 25 mg via ORAL
  Filled 2021-05-02 (×2): qty 1

## 2021-05-02 MED ORDER — LORAZEPAM 2 MG/ML IJ SOLN
0.0000 mg | Freq: Four times a day (QID) | INTRAMUSCULAR | Status: DC
  Administered 2021-05-02 – 2021-05-03 (×4): 4 mg via INTRAVENOUS
  Filled 2021-05-02: qty 2
  Filled 2021-05-02: qty 1
  Filled 2021-05-02 (×3): qty 2
  Filled 2021-05-02: qty 1

## 2021-05-02 MED ORDER — HALOPERIDOL LACTATE 5 MG/ML IJ SOLN
10.0000 mg | Freq: Four times a day (QID) | INTRAMUSCULAR | Status: DC | PRN
  Administered 2021-05-02 – 2021-05-13 (×8): 10 mg via INTRAVENOUS
  Filled 2021-05-02 (×9): qty 2

## 2021-05-02 MED ORDER — HALOPERIDOL LACTATE 5 MG/ML IJ SOLN: 10.0000 mg | Freq: Four times a day (QID) | INTRAMUSCULAR | Status: DC | PRN

## 2021-05-02 MED ORDER — ADULT MULTIVITAMIN W/MINERALS CH: 1.0000 | ORAL_TABLET | Freq: Every day | ORAL | Status: DC

## 2021-05-02 MED ORDER — LORAZEPAM 2 MG/ML IJ SOLN: 0.0000 mg | Freq: Two times a day (BID) | INTRAMUSCULAR | Status: DC

## 2021-05-02 MED ORDER — LORAZEPAM 2 MG/ML IJ SOLN
1.0000 mg | INTRAMUSCULAR | Status: AC | PRN
  Administered 2021-05-02: 4 mg via INTRAVENOUS
  Administered 2021-05-02: 2 mg via INTRAVENOUS
  Administered 2021-05-02: 4 mg via INTRAVENOUS
  Administered 2021-05-02 (×2): 2 mg via INTRAVENOUS
  Administered 2021-05-02 (×2): 4 mg via INTRAVENOUS
  Administered 2021-05-03 (×3): 2 mg via INTRAVENOUS
  Administered 2021-05-03 (×2): 4 mg via INTRAVENOUS
  Administered 2021-05-04 (×2): 2 mg via INTRAVENOUS
  Administered 2021-05-04: 4 mg via INTRAVENOUS
  Filled 2021-05-02 (×2): qty 1
  Filled 2021-05-02: qty 2
  Filled 2021-05-02 (×4): qty 1
  Filled 2021-05-02 (×2): qty 2
  Filled 2021-05-02 (×2): qty 1
  Filled 2021-05-02: qty 2
  Filled 2021-05-02: qty 1
  Filled 2021-05-02 (×2): qty 2
  Filled 2021-05-02: qty 1

## 2021-05-02 NOTE — Progress Notes (Signed)
OT Cancellation Note  Patient Details Name: Nathan West MRN: [id number] DOB: March 19, 1990   Cancelled Treatment:    Reason Eval/Treat Not Completed: Medical issues which prohibited therapy (Pt delusional, on CIWA protocol, nsg asking to hold today. Will check back tomorrow.)  [facility] 05/02/2021, 7:58 AM Maurie Boettcher, OT/L   Acute OT Clinical Specialist Acute Rehabilitation Services Pager [phone] Office [phone]

## 2021-05-02 NOTE — Progress Notes (Signed)
CIWR , protocol initiated due to pt  experiencing diaphoresis, anxious, tachycardia. Lorazepam prescribed for Withdrawal symptoms, anxiety. Chest X-ray ordered  due to c/o of new pain by patient.
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
## 2021-05-02 NOTE — Progress Notes (Addendum)
..  Trauma Event Note    While rounding pt noted to be very anxious, tachy 120's, diaphoretic, pt reports prior to being hospitalized he did drink etoh daily as well as THC usage. CIWA 15 at this time.  [place] collection device full, new sahara connected, remains on suction, new drainage immediately noted.  Pt and girlfriend at bedside update on plan of care, Dr. Andrey Campanile paged with update, will monitor for new orders. Primary nurse Gladys aware of POC.   Interventions:  See Narrative  Plan of Care: Dr. Andrey Campanile paged for new orders.  0235-per Dr Andrey Campanile stat port chest and initiate CIWA protocol. Primary nurse Venita Sheffield updated on POC and new orders.   MD Notified:Wilson  Call [phone]  Last imported Vital Signs BP 133/70   Pulse (!) 122   Temp 99.2 F (37.3 C) (Oral)   Resp (!) 34   Ht 5\' 9"  (1.753 m)   Wt 92 kg   SpO2 97%   BMI 29.95 kg/m   Trending CBC Recent Labs    04/29/21 0423 04/30/21 0500 05/01/21 0520  WBC 12.3* 12.0* 12.1*  HGB 7.5* 7.6* 8.6*  HCT 22.1* 23.1* 25.4*  PLT 151 246 329    Trending Coag's No results for input(s): APTT, INR in the last 72 hours.  Trending BMET Recent Labs    04/29/21 0423 04/30/21 0500 05/01/21 0520  NA 136 137 133*  K 3.5 3.6 3.4*  CL 105 102 101  CO2 24 25 25   BUN <5* 6 <5*  CREATININE 0.95 0.93 0.84  GLUCOSE 123* 129* 125*      Alyza Artiaga Dee  Trauma Response RN  Please call TRN at [phone] for further assistance.

## 2021-05-02 NOTE — Progress Notes (Signed)
Patient encouraged and educated on the continuous and consistent of the use of incentive spirometer. Chest tube chamber changed. A total of 100 ml of out noted. Patient  is very anxious and diaphoresis. MD notified. Will continue monitoring.

## 2021-05-02 NOTE — Progress Notes (Signed)
PT Cancellation Note  Patient Details Name: JIMIE KUWAHARA MRN: [id number] DOB: 1990-06-14   Cancelled Treatment:    Reason Eval/Treat Not Completed: Medical issues which prohibited therapy; patient delusional and nursing requests hold PT today.     Elray Mcgregor 05/02/2021, 8:59 AM Sheran Lawless, PT Acute Rehabilitation Services Pager:[phone] [phone] 05/02/2021

## 2021-05-02 NOTE — Progress Notes (Signed)
   Subjective:  Patient confused this AM, asking about "what the code is". Chest tube remains in place with general surgery.   Objective:   VITALS:   Vitals:   05/02/21 0553 05/02/21 0554 05/02/21 0555 05/02/21 0646  BP:    (!) 103/59  Pulse: (!) 136 (!) 137 (!) 132 (!) 135  Resp: (!) 27 (!) 31 (!) 29   Temp:    99 F (37.2 C)  TempSrc:    Axillary  SpO2: 94% 94% 94% 95%  Weight:      Height:        Left arm is in a dressing which is clean dry and intact.  2+ radial pulse.  Fingers warm and well-perfused.  He is able to gently flex at all digits.  He he is able to extend his EPL of the left hand.  Compartments are compressible dorsally and volarly.   Lab Results  Component Value Date   WBC 12.1 (H) 05/01/2021   HGB 8.6 (L) 05/01/2021   HCT 25.4 (L) 05/01/2021   MCV 89.8 05/01/2021   PLT 329 05/01/2021     Assessment/Plan:  5 Days Post-Op status post left both bone forearm fracture fixation with fasciotomy closure, removal of Ex-Fix, radial nerve exploration  - Patient to continue to work with PT/OT to optimize mobilization safely - DVT ppx - SCDs, ambulation, Lovenox for contralateral upper extremity DVT, okay to start from orthopedic perspective - NWB operative extremity, he may begin gentle range of motion about the elbow and wrist/hand as tolerated, no lifting with left arm -Orthopedics will continue to follow  Canisha Issac 05/02/2021, 6:47 AM

## 2021-05-02 NOTE — Consult Note (Signed)
Brief Psychiatry Consult Note  Received consult in AM for acute stress disorder. Since that time, pt found to be diaphoretic, tachycardic, and actively hallucinating in setting of alcohol abuse outside the hospital. Has reportedly been eating outside food (chipotle), initial CBC prior to transfusions w/o macrocytosis, appropriate for oral thiamine dose.  At time we saw pt was sedated, had just gotten 4 mg of lorazepam + dilaudid from nurse at bedside. Was asleep when we entered the room, previously agitated to the point he bit off his chest tube.   - Will reassess tomorrow for acute stress disorder - defer mgmt of etoh w/d to trauma team  Joycelyn Schmid A Fairy Ashlock

## 2021-05-02 NOTE — Progress Notes (Signed)
Trauma Event Note   Initial Focused Assessment:  Pt agitated, confused and tachycardic. (120-130's) Mika, RN was at bedside giving pt 4mg  ativan.  Interventions: Observed pt for a few minutes, helped to try and reorient him and place him back in the bed with Mika.    Spoke with pt's girlfriend at bedside to reassure her with his withdrawal symptoms.  She states that he drinks several shots of tequila daily and also smokes mariajuana daily.  Placed patient in a posey belt for safety due to him continuously attempting to get OOB and being so confused.   Placed pt on 2L [place] due to desaturation of 88-90% on RA.  Ordered pt carmex due to chapped lips.  Awaiting pharmacy to verify.  Assessed chest tube.  Good placement - new sahara as of early this morning.    Plan of Care: Continue observation and continue to stay on top of ativan for high CIWA score.  Continue to reorient patient.  Continue posey for now.   Last imported Vital Signs BP (!) 103/59 (BP Location: Right Leg)   Pulse (!) 135   Temp 99 F (37.2 C) (Axillary)   Resp (!) 29   Ht 5\' 9"  (1.753 m)   Wt 202 lb 13.2 oz (92 kg)   SpO2 95%   BMI 29.95 kg/m   Trending CBC Recent Labs    04/30/21 0500 05/01/21 0520  WBC 12.0* 12.1*  HGB 7.6* 8.6*  HCT 23.1* 25.4*  PLT 246 329    Trending Coag's No results for input(s): APTT, INR in the last 72 hours.  Trending BMET Recent Labs    04/30/21 0500 05/01/21 0520 05/02/21 0840  NA 137 133* 131*  K 3.6 3.4* 3.6  CL 102 101 98  CO2 25 25 23   BUN 6 <5* 12  CREATININE 0.93 0.84 1.23  GLUCOSE 129* 125* 133*     Madalynne Gutmann W  Trauma Response RN  Please call TRN at [phone] for further assistance.

## 2021-05-02 NOTE — Progress Notes (Signed)
Brief Nutrition Note  Noted MD ordered Cortrak this morning. Discussed with MD and Cortrak was ordered primarily for medication administration. Discussed pt with RN. Pt took morning medications without issue and has been eating well. RN reports pt's family/girlfriend have been bringing in outside food like [employer] for him to eat. RN reports pt with increased agitation earlier today, biting at leads and pulling out chest tube. Discussed further with MD who would like to hold off on Cortrak placement at this time. Will plan to reevaluate for Cortrak need with next available service date being 05/04/21.  RD will continue to follow pt during admission.   Mertie Clause, MS, RD, LDN Inpatient Clinical Dietitian Please see AMiON for contact information.

## 2021-05-02 NOTE — Progress Notes (Signed)
  Patient continues experiencing elevated and sustained HR and RP. MD, Dr. Andrey Campanile notified. Metoprolol 5 mg prescribed for HR greater than 120. Med administered as prescribed. Will continue monitoring.

## 2021-05-02 DEATH — deceased

## 2021-05-03 ENCOUNTER — Inpatient Hospital Stay (HOSPITAL_COMMUNITY): Payer: Medicaid Other

## 2021-05-03 DIAGNOSIS — R410 Disorientation, unspecified: Secondary | ICD-10-CM

## 2021-05-03 DIAGNOSIS — R451 Restlessness and agitation: Secondary | ICD-10-CM

## 2021-05-03 LAB — CBC
HCT: 21.2 % — ABNORMAL LOW (ref 39.0–52.0)
HCT: 20.5 % — ABNORMAL LOW (ref 39.0–52.0)
Hemoglobin: 7.3 g/dL — ABNORMAL LOW (ref 13.0–17.0)
Hemoglobin: 6.5 g/dL — CL (ref 13.0–17.0)
MCH: 30.7 pg (ref 26.0–34.0)
MCH: 29.4 pg (ref 26.0–34.0)
MCHC: 34.4 g/dL (ref 30.0–36.0)
MCHC: 31.7 g/dL (ref 30.0–36.0)
MCV: 89.1 fL (ref 80.0–100.0)
MCV: 92.8 fL (ref 80.0–100.0)
Platelets: 457 10*3/uL — ABNORMAL HIGH (ref 150–400)
Platelets: 456 10*3/uL — ABNORMAL HIGH (ref 150–400)
RBC: 2.38 MIL/uL — ABNORMAL LOW (ref 4.22–5.81)
RBC: 2.21 MIL/uL — ABNORMAL LOW (ref 4.22–5.81)
RDW: 14.5 % (ref 11.5–15.5)
RDW: 14.6 % (ref 11.5–15.5)
WBC: 26.8 10*3/uL — ABNORMAL HIGH (ref 4.0–10.5)
WBC: 26.3 10*3/uL — ABNORMAL HIGH (ref 4.0–10.5)
nRBC: 0.5 % — ABNORMAL HIGH (ref 0.0–0.2)
nRBC: 0.2 % (ref 0.0–0.2)

## 2021-05-03 LAB — URINALYSIS, ROUTINE W REFLEX MICROSCOPIC
Bilirubin Urine: NEGATIVE
Glucose, UA: NEGATIVE mg/dL
Hgb urine dipstick: NEGATIVE
Ketones, ur: NEGATIVE mg/dL
Leukocytes,Ua: NEGATIVE
Nitrite: NEGATIVE
Protein, ur: 30 mg/dL — AB
Specific Gravity, Urine: 1.024 (ref 1.005–1.030)
pH: 5 (ref 5.0–8.0)

## 2021-05-03 LAB — MAGNESIUM: Magnesium: 2.3 mg/dL (ref 1.7–2.4)

## 2021-05-03 LAB — BASIC METABOLIC PANEL WITH GFR
Anion gap: 8 (ref 5–15)
BUN: 13 mg/dL (ref 6–20)
CO2: 23 mmol/L (ref 22–32)
Calcium: 7.7 mg/dL — ABNORMAL LOW (ref 8.9–10.3)
Chloride: 102 mmol/L (ref 98–111)
Creatinine, Ser: 1.19 mg/dL (ref 0.61–1.24)
GFR, Estimated: >60 mL/min
Glucose, Bld: 125 mg/dL — ABNORMAL HIGH (ref 70–99)
Potassium: 3.9 mmol/L (ref 3.5–5.1)
Sodium: 133 mmol/L — ABNORMAL LOW (ref 135–145)

## 2021-05-03 LAB — HEMOGLOBIN AND HEMATOCRIT, BLOOD
HCT: 16.8 % — ABNORMAL LOW (ref 39.0–52.0)
Hemoglobin: 5.7 g/dL — CL (ref 13.0–17.0)

## 2021-05-03 LAB — PREPARE RBC (CROSSMATCH)

## 2021-05-03 LAB — PHOSPHORUS: Phosphorus: 2.5 mg/dL (ref 2.5–4.6)

## 2021-05-03 MED ORDER — QUETIAPINE FUMARATE 100 MG PO TABS: 100.0000 mg | ORAL_TABLET | Freq: Two times a day (BID) | ORAL | Status: DC

## 2021-05-03 MED ORDER — THIAMINE HCL 100 MG PO TABS
100.0000 mg | ORAL_TABLET | Freq: Every day | ORAL | Status: DC
Start: 2021-05-03 — End: 2021-05-05

## 2021-05-03 MED ORDER — DOCUSATE SODIUM 50 MG/5ML PO LIQD
100.0000 mg | Freq: Two times a day (BID) | ORAL | Status: DC
  Administered 2021-05-03: 100 mg via ORAL
  Filled 2021-05-03: qty 10

## 2021-05-03 MED ORDER — CHLORDIAZEPOXIDE HCL 25 MG PO CAPS
100.0000 mg | ORAL_CAPSULE | Freq: Four times a day (QID) | ORAL | Status: DC
  Administered 2021-05-03 – 2021-05-16 (×52): 100 mg
  Filled 2021-05-03 (×53): qty 4

## 2021-05-03 MED ORDER — ADULT MULTIVITAMIN W/MINERALS CH
1.0000 | ORAL_TABLET | Freq: Every day | ORAL | Status: DC
  Administered 2021-05-03 – 2021-06-15 (×42): 1
  Filled 2021-05-03 (×42): qty 1

## 2021-05-03 MED ORDER — METHOCARBAMOL 500 MG PO TABS
1000.0000 mg | ORAL_TABLET | Freq: Three times a day (TID) | ORAL | Status: DC
  Administered 2021-05-03 – 2021-06-15 (×128): 1000 mg
  Filled 2021-05-03 (×127): qty 2

## 2021-05-03 MED ORDER — GABAPENTIN 250 MG/5ML PO SOLN
200.0000 mg | Freq: Three times a day (TID) | ORAL | Status: DC
  Administered 2021-05-03 – 2021-05-20 (×51): 200 mg
  Filled 2021-05-03 (×56): qty 4

## 2021-05-03 MED ORDER — SODIUM CHLORIDE 0.9% IV SOLUTION: Freq: Once | INTRAVENOUS | Status: AC

## 2021-05-03 MED ORDER — QUETIAPINE FUMARATE 100 MG PO TABS
100.0000 mg | ORAL_TABLET | Freq: Two times a day (BID) | ORAL | Status: DC
  Administered 2021-05-03: 100 mg
  Filled 2021-05-03: qty 1

## 2021-05-03 MED ORDER — QUETIAPINE FUMARATE 200 MG PO TABS
200.0000 mg | ORAL_TABLET | Freq: Two times a day (BID) | ORAL | Status: DC
  Administered 2021-05-03 – 2021-06-01 (×58): 200 mg
  Filled 2021-05-03 (×58): qty 1

## 2021-05-03 MED ORDER — CHLORDIAZEPOXIDE HCL 25 MG PO CAPS
25.0000 mg | ORAL_CAPSULE | Freq: Three times a day (TID) | ORAL | Status: DC
  Administered 2021-05-03: 25 mg

## 2021-05-03 MED ORDER — OXYCODONE HCL 5 MG PO TABS
5.0000 mg | ORAL_TABLET | ORAL | Status: DC | PRN
  Administered 2021-05-03 – 2021-05-10 (×10): 10 mg
  Administered 2021-05-11: 5 mg
  Administered 2021-05-12 – 2021-06-08 (×8): 10 mg
  Filled 2021-05-03 (×17): qty 2
  Filled 2021-05-03: qty 1
  Filled 2021-05-03: qty 2

## 2021-05-03 MED ORDER — BUSPIRONE HCL 10 MG PO TABS
10.0000 mg | ORAL_TABLET | Freq: Three times a day (TID) | ORAL | Status: DC
  Administered 2021-05-03: 10 mg

## 2021-05-03 MED ORDER — FOLIC ACID 1 MG PO TABS
1.0000 mg | ORAL_TABLET | Freq: Every day | ORAL | Status: DC
  Administered 2021-05-03 – 2021-06-15 (×43): 1 mg
  Filled 2021-05-03 (×43): qty 1

## 2021-05-03 MED ORDER — GABAPENTIN 250 MG/5ML PO SOLN
200.0000 mg | Freq: Three times a day (TID) | ORAL | Status: DC
  Administered 2021-05-03: 200 mg via ORAL
  Filled 2021-05-03 (×3): qty 4

## 2021-05-03 MED ORDER — SODIUM CHLORIDE 0.9% IV SOLUTION: Freq: Once | INTRAVENOUS | Status: DC

## 2021-05-03 MED ORDER — LACTATED RINGERS IV BOLUS
500.0000 mL | Freq: Once | INTRAVENOUS | Status: AC
  Administered 2021-05-03: 500 mL via INTRAVENOUS

## 2021-05-03 MED ORDER — ACETAMINOPHEN 500 MG PO TABS
1000.0000 mg | ORAL_TABLET | Freq: Four times a day (QID) | ORAL | Status: DC
  Administered 2021-05-03 – 2021-05-23 (×68): 1000 mg
  Filled 2021-05-03 (×70): qty 2

## 2021-05-03 MED ORDER — ENSURE ENLIVE PO LIQD
237.0000 mL | Freq: Three times a day (TID) | ORAL | Status: DC
  Administered 2021-05-03 – 2021-05-06 (×5): 237 mL

## 2021-05-03 MED ORDER — SPIRITUS FRUMENTI
1.0000 | Freq: Three times a day (TID) | ORAL | Status: DC
  Administered 2021-05-03 – 2021-05-16 (×37): 1
  Filled 2021-05-03 (×43): qty 1

## 2021-05-03 MED ORDER — THIAMINE HCL 100 MG/ML IJ SOLN
100.0000 mg | Freq: Every day | INTRAMUSCULAR | Status: DC
  Administered 2021-05-03 – 2021-05-05 (×3): 100 mg via INTRAVENOUS
  Filled 2021-05-03 (×2): qty 2
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
## 2021-05-03 NOTE — Progress Notes (Signed)
PT Cancellation Note  Patient Details Name: Nathan West MRN: [id number] DOB: July 12, 1990   Cancelled Treatment:    Reason Eval/Treat Not Completed: Medical issues which prohibited therapy; spoke with RN who reports pt still not appropriate for PT today due to symptoms of delirium with withdrawal, and now with low BP from sedating meds.  Will continue attempts.   Elray Mcgregor 05/03/2021, 12:15 PM Sheran Lawless, PT Acute Rehabilitation Services Pager:[phone] Office:[phone] 05/03/2021
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
## 2021-05-03 NOTE — Progress Notes (Signed)
OT Cancellation Note  Patient Details Name: Nathan West MRN: [id number] DOB: [birth year]   Cancelled Treatment:    Reason Eval/Treat Not Completed: Medical issues which prohibited therapy (on Precedex/lethargic. Will hold today and check on later date.)  [facility] 05/03/2021, 6:53 AM Maurie Boettcher, OT/L   Acute OT Clinical Specialist [place] Pager [phone] Office [phone]
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
## 2021-05-03 NOTE — Progress Notes (Signed)
This writer attention is drawn to patients room due to fussing and cussing between patient and girlfriend at bedisde, upon entering room patient was trying to get out of bed to look for car keys that he insisted was on the shelf. I was able to get patient back in baed obtain assesment informed patient and girlfriend I was going to get medications to help patient calm down and relax- Ativan and Dilaudid was given. At this point TRRN was entering room to assess patient; patient was still trying to get out of bed and walk around we were able to get p[patient back in bed, and decided it would be appropriate to obtain order for soft waist restraint.  1245-While pulling medications patient girlfriend knocks on window hollering my name I quickly go to see what the issue was upon entering room NT is in room also patient is biting cardiac monitoring cords and bites into half, then starts biting on chest tube and pulling at it. This nurse notifies MD of patients behaviors leaves room to obtain my help from staff to help keep patient safe upon entering back into room patient remains worked up and manges to pull out chest tube. MD is made aware and patient placed in bilteral ankle and right wrist restraint.Patients girlfriend is asked to leave to decrease stimulation and allow patient to rest. Psych comes to see patient, however, he is sleep told them of the event that had taken place they decided to let patient remain sleep stated " they were consulted for acure stress reaction and believed they had 24 hours after conult to see patient." Approximately 10-15 minutes later MD and pharmacist come to room again patient is still asleep following event.  1700- Patient awakens still hallucinating trying to climb out of bed taking unrestrained arm trying to undo restraints on ankles while cussing at this writer and NT stateing " we are holding him against his will." This writer asks patient not to hit her as he is swinging his arm  and fussing; I was able to given PRNs to help calm patient back down.   62- I notified MD that I know it is the goal to keep patient on Progressive side, and is taking 4mg  Ativan every event to calm patient down and I have been doing this all shift and its not safe for patient or staff. Md agreed to transfer patient to ICU.  1845- still awiting order for transfer paged on call MD informed her that privous MD was going to place order for transfer,however, we were still waiting and patiens HR was substaining in 150-160s . Order was obtained patient palcement called in reference to transfer they stated there no were no beds at the moment and patient mother Myoshia called to make aware of transfer.

## 2021-05-03 NOTE — Progress Notes (Signed)
Trauma Event Note    TRN to bedside to round shortly after report d/t elevated HR 160s, missing CIWA documentation from dayshift. Patient sleeping upon my arrival to bedside, skin warm and dry. Checked in with primary nurse Venita Sheffield, plan to transfer to ICU to initiate precedex - bed being cleaned. CIWA 20 per Venita Sheffield, has not received dose of ativan so far this shift.   Trending CBC Recent Labs    04/30/21 0500 05/01/21 0520 05/03/21 0336  WBC 12.0* 12.1* PENDING  HGB 7.6* 8.6* 7.3*  HCT 23.1* 25.4* 21.2*  PLT 246 329 457*    Trending Coag's No results for input(s): APTT, INR in the last 72 hours.  Trending BMET Recent Labs    05/02/21 0840 05/02/21 1731 05/03/21 0336  NA 131* 134* 133*  K 3.6 3.6 3.9  CL 98 101 102  CO2 23 22 23   BUN 12 12 13   CREATININE 1.23 1.24 1.19  GLUCOSE 133* 134* 125*      Travus Oren O Cynthie Garmon  Trauma Response RN  Please call TRN at [phone] for further assistance.

## 2021-05-03 NOTE — Progress Notes (Signed)
Critical Hgb 5.7. Dr. Bedelia Person informed.

## 2021-05-03 NOTE — Progress Notes (Addendum)
Trauma Event Note   CIWA 34 on my assessment, see flowsheet. Ativan given. Dr. Fredricka Bonine notified, RR Mindy made aware.   Trending CBC Recent Labs    04/30/21 0500 05/01/21 0520 05/03/21 0336  WBC 12.0* 12.1* 26.8*  HGB 7.6* 8.6* 7.3*  HCT 23.1* 25.4* 21.2*  PLT 246 329 457*    Trending Coag's No results for input(s): APTT, INR in the last 72 hours.  Trending BMET Recent Labs    05/02/21 0840 05/02/21 1731 05/03/21 0336  NA 131* 134* 133*  K 3.6 3.6 3.9  CL 98 101 102  CO2 23 22 23   BUN 12 12 13   CREATININE 1.23 1.24 1.19  GLUCOSE 133* 134* 125*      Nathan West Nathan West  Trauma Response RN  Please call TRN at [phone] for further assistance.

## 2021-05-03 NOTE — Progress Notes (Signed)
Pt w/ soft Bps w MAP < 65, temp 99.4 post RBC. Per V.O., Dr. Bedelia Person, "give 500 cc LR and ensure H&H drawn and sent to lab".

## 2021-05-03 NOTE — Progress Notes (Signed)
Patient transferred to 4NICU room 25. Patient remains on wrist and waist restraints. Alert but pleasantly confused.

## 2021-05-03 NOTE — Progress Notes (Signed)
Trauma Event Note  Patient agitated this AM, PRN haldol given. Patient in 4 point restraints, precedex gtt. NG tube placed.  This PM patient HGB 6.5, 1U PRBCs ordered. CT Chest ordered, may require chest tube. Handoff with bedside RN Selena Batten.   Last imported Vital Signs BP 117/70   Pulse (!) 109   Temp 98.2 F (36.8 C) (Axillary)   Resp (!) 29   Ht 5\' 9"  (1.753 m)   Wt 92 kg   SpO2 98%   BMI 29.95 kg/m   Trending CBC Recent Labs    05/01/21 0520 05/03/21 0336 05/03/21 1446  WBC 12.1* 26.8* 26.3*  HGB 8.6* 7.3* 6.5*  HCT 25.4* 21.2* 20.5*  PLT 329 457* 456*    Trending Coag's No results for input(s): APTT, INR in the last 72 hours.  Trending BMET Recent Labs    05/02/21 0840 05/02/21 1731 05/03/21 0336  NA 131* 134* 133*  K 3.6 3.6 3.9  CL 98 101 102  CO2 23 22 23   BUN 12 12 13   CREATININE 1.23 1.24 1.19  GLUCOSE 133* 134* 125*    Nathan West [name]  Trauma Response RN  Please call TRN at [phone] for further assistance.

## 2021-05-03 NOTE — Progress Notes (Signed)
   Subjective: Patient has been increasingly confused over the course of the day yesterday. He has been seen and evaluated by psychiatry with a concern for alcohol abuse and possible withdrawal.  He remains stable from a musculoskeletal standpoint    Objective:   VITALS:   Vitals:   05/03/21 0500 05/03/21 0600 05/03/21 0700 05/03/21 0800  BP: (!) 113/50 (!) 90/47 (!) 101/51   Pulse: (!) 120 (!) 112 (!) 106   Resp: (!) 33 (!) 32 (!) 32   Temp: 99.7 F (37.6 C)   (!) 103 F (39.4 C)  TempSrc: Axillary   Axillary  SpO2: 100% 100% 100%   Weight:      Height:        Left arm is in a dressing which is clean dry and intact.  2+ radial pulse.  Fingers warm and well-perfused.  He is able to gently flex at all digits.  He he is able to extend his EPL of the left hand.  Compartments are compressible dorsally and volarly.   Lab Results  Component Value Date   WBC 26.8 (H) 05/03/2021   HGB 7.3 (L) 05/03/2021   HCT 21.2 (L) 05/03/2021   MCV 89.1 05/03/2021   PLT 457 (H) 05/03/2021     Assessment/Plan:  6 Days Post-Op status post left both bone forearm fracture fixation with fasciotomy closure, removal of Ex-Fix, radial nerve exploration  - Patient to continue to work with occupational therapy when he is able - DVT ppx - SCDs, ambulation, Lovenox for contralateral upper extremity DVT - NWB operative extremity, he may begin gentle range of motion about the elbow and wrist/hand as tolerated, no lifting with left arm when he is able -Orthopedics will continue to follow  Blessed Girdner 05/03/2021, 8:25 AM

## 2021-05-03 NOTE — Progress Notes (Signed)
Pt placed on 2 mcg of Precedex per Dr. Bedelia Person. Per Press photographer.

## 2021-05-03 NOTE — Progress Notes (Signed)
Pt w/ jerking motions in upper and lower extremities for about 20 sec.

## 2021-05-04 ENCOUNTER — Inpatient Hospital Stay (HOSPITAL_COMMUNITY): Payer: Medicaid Other

## 2021-05-04 ENCOUNTER — Encounter (HOSPITAL_COMMUNITY): Payer: Self-pay

## 2021-05-04 LAB — CBC WITH DIFFERENTIAL/PLATELET
Abs Immature Granulocytes: 0.15 10*3/uL — ABNORMAL HIGH (ref 0.00–0.07)
Basophils Absolute: 0 10*3/uL (ref 0.0–0.1)
Basophils Relative: 0 %
Eosinophils Absolute: 0.2 10*3/uL (ref 0.0–0.5)
Eosinophils Relative: 1 %
HCT: 23.7 % — ABNORMAL LOW (ref 39.0–52.0)
Hemoglobin: 8.1 g/dL — ABNORMAL LOW (ref 13.0–17.0)
Immature Granulocytes: 1 %
Lymphocytes Relative: 4 %
Lymphs Abs: 0.8 10*3/uL (ref 0.7–4.0)
MCH: 30.1 pg (ref 26.0–34.0)
MCHC: 34.2 g/dL (ref 30.0–36.0)
MCV: 88.1 fL (ref 80.0–100.0)
Monocytes Absolute: 0.9 10*3/uL (ref 0.1–1.0)
Monocytes Relative: 5 %
Neutro Abs: 17 10*3/uL — ABNORMAL HIGH (ref 1.7–7.7)
Neutrophils Relative %: 89 %
Platelets: 469 10*3/uL — ABNORMAL HIGH (ref 150–400)
RBC: 2.69 MIL/uL — ABNORMAL LOW (ref 4.22–5.81)
RDW: 15.9 % — ABNORMAL HIGH (ref 11.5–15.5)
WBC: 19.1 10*3/uL — ABNORMAL HIGH (ref 4.0–10.5)
nRBC: 0 % (ref 0.0–0.2)

## 2021-05-04 LAB — COMPREHENSIVE METABOLIC PANEL
ALT: 57 U/L — ABNORMAL HIGH (ref 0–44)
AST: 46 U/L — ABNORMAL HIGH (ref 15–41)
Albumin: 1.7 g/dL — ABNORMAL LOW (ref 3.5–5.0)
Alkaline Phosphatase: 99 U/L (ref 38–126)
Anion gap: 7 (ref 5–15)
BUN: 15 mg/dL (ref 6–20)
CO2: 21 mmol/L — ABNORMAL LOW (ref 22–32)
Calcium: 7.3 mg/dL — ABNORMAL LOW (ref 8.9–10.3)
Chloride: 111 mmol/L (ref 98–111)
Creatinine, Ser: 0.89 mg/dL (ref 0.61–1.24)
GFR, Estimated: >60 mL/min (ref >60)
Glucose, Bld: 118 mg/dL — ABNORMAL HIGH (ref 70–99)
Potassium: 4.1 mmol/L (ref 3.5–5.1)
Sodium: 139 mmol/L (ref 135–145)
Total Bilirubin: 0.4 mg/dL (ref 0.3–1.2)
Total Protein: 5.1 g/dL — ABNORMAL LOW (ref 6.5–8.1)

## 2021-05-04 LAB — TRAUMA TEG PANEL
CFF Max Amplitude: >52 mm — ABNORMAL HIGH (ref 15–32)
CFF Max Amplitude: >52 mm — ABNORMAL HIGH (ref 15–32)
Citrated Kaolin (R): >17 min — ABNORMAL HIGH (ref 4.6–9.1)
Citrated Kaolin (R): 9.1 min (ref 4.6–9.1)
Citrated Rapid TEG (MA): >75 mm — ABNORMAL HIGH (ref 52–70)
Citrated Rapid TEG (MA): >75 mm — ABNORMAL HIGH (ref 52–70)
Lysis at 30 Minutes: 0.1 % (ref 0.0–2.6)

## 2021-05-04 LAB — POCT I-STAT 7, (LYTES, BLD GAS, ICA,H+H)
Acid-base deficit: 5 mmol/L — ABNORMAL HIGH (ref 0.0–2.0)
Bicarbonate: 17.9 mmol/L — ABNORMAL LOW (ref 20.0–28.0)
Calcium, Ion: 1.12 mmol/L — ABNORMAL LOW (ref 1.15–1.40)
HCT: 25 % — ABNORMAL LOW (ref 39.0–52.0)
Hemoglobin: 8.5 g/dL — ABNORMAL LOW (ref 13.0–17.0)
O2 Saturation: 92 %
Patient temperature: 98.6
Potassium: 3.9 mmol/L (ref 3.5–5.1)
Sodium: 139 mmol/L (ref 135–145)
TCO2: 19 mmol/L — ABNORMAL LOW (ref 22–32)
pCO2 arterial: 24.8 mmHg — ABNORMAL LOW (ref 32–48)
pH, Arterial: 7.467 — ABNORMAL HIGH (ref 7.35–7.45)
pO2, Arterial: 59 mmHg — ABNORMAL LOW (ref 83–108)

## 2021-05-04 LAB — CBC
HCT: 21.5 % — ABNORMAL LOW (ref 39.0–52.0)
Hemoglobin: 7.3 g/dL — ABNORMAL LOW (ref 13.0–17.0)
MCH: 29.9 pg (ref 26.0–34.0)
MCHC: 34 g/dL (ref 30.0–36.0)
MCV: 88.1 fL (ref 80.0–100.0)
Platelets: 402 10*3/uL — ABNORMAL HIGH (ref 150–400)
RBC: 2.44 MIL/uL — ABNORMAL LOW (ref 4.22–5.81)
RDW: 15.3 % (ref 11.5–15.5)
WBC: 23.8 10*3/uL — ABNORMAL HIGH (ref 4.0–10.5)
nRBC: 0.1 % (ref 0.0–0.2)

## 2021-05-04 LAB — POCT I-STAT, CHEM 8
BUN: 21 mg/dL — ABNORMAL HIGH (ref 6–20)
Calcium, Ion: 1.11 mmol/L — ABNORMAL LOW (ref 1.15–1.40)
Chloride: 105 mmol/L (ref 98–111)
Creatinine, Ser: 0.9 mg/dL (ref 0.61–1.24)
Glucose, Bld: 126 mg/dL — ABNORMAL HIGH (ref 70–99)
HCT: 20 % — ABNORMAL LOW (ref 39.0–52.0)
Hemoglobin: 6.8 g/dL — CL (ref 13.0–17.0)
Potassium: 4.3 mmol/L (ref 3.5–5.1)
Sodium: 138 mmol/L (ref 135–145)
TCO2: 23 mmol/L (ref 22–32)

## 2021-05-04 LAB — HEMOGLOBIN AND HEMATOCRIT, BLOOD
HCT: 25.6 % — ABNORMAL LOW (ref 39.0–52.0)
Hemoglobin: 8.3 g/dL — ABNORMAL LOW (ref 13.0–17.0)

## 2021-05-04 LAB — PHOSPHORUS: Phosphorus: 1.9 mg/dL — ABNORMAL LOW (ref 2.5–4.6)

## 2021-05-04 LAB — MAGNESIUM: Magnesium: 2.2 mg/dL (ref 1.7–2.4)

## 2021-05-04 MED ORDER — LIDOCAINE HCL (PF) 1 % IJ SOLN
INTRAMUSCULAR | Status: AC
  Filled 2021-05-04: qty 15

## 2021-05-04 MED ORDER — POTASSIUM PHOSPHATES 15 MMOLE/5ML IV SOLN
15.0000 mmol | Freq: Once | INTRAVENOUS | Status: AC
  Administered 2021-05-04: 15 mmol via INTRAVENOUS
  Filled 2021-05-04: qty 5

## 2021-05-04 MED ORDER — IOHEXOL 300 MG/ML  SOLN
100.0000 mL | Freq: Once | INTRAMUSCULAR | Status: AC | PRN
  Administered 2021-05-04: 100 mL via INTRAVENOUS

## 2021-05-04 MED ORDER — LIDOCAINE HCL (PF) 1 % IJ SOLN
INTRAMUSCULAR | Status: AC
  Filled 2021-05-04: qty 5

## 2021-05-04 MED ORDER — MIDAZOLAM HCL 2 MG/2ML IJ SOLN
INTRAMUSCULAR | Status: AC | PRN
Start: 2021-05-04 — End: 2021-05-04
  Administered 2021-05-04 (×4): 1 mg via INTRAVENOUS

## 2021-05-04 MED ORDER — FENTANYL CITRATE (PF) 100 MCG/2ML IJ SOLN
INTRAMUSCULAR | Status: AC | PRN
  Administered 2021-05-04 (×2): 50 ug via INTRAVENOUS

## 2021-05-04 MED ORDER — MIDAZOLAM HCL 2 MG/2ML IJ SOLN
INTRAMUSCULAR | Status: AC
  Filled 2021-05-04: qty 4

## 2021-05-04 MED ORDER — LIDOCAINE HCL 1 % IJ SOLN
INTRAMUSCULAR | Status: AC
  Filled 2021-05-04: qty 10

## 2021-05-04 MED ORDER — FENTANYL CITRATE (PF) 100 MCG/2ML IJ SOLN
INTRAMUSCULAR | Status: AC
  Filled 2021-05-04: qty 4

## 2021-05-04 NOTE — Progress Notes (Signed)
Pt respiratory rate in the 40's and as high as 48 despite previous Ativan administration. Trauma notified; no intervention at the moment. Will continue to monitor.

## 2021-05-04 NOTE — TOC Progression Note (Signed)
Transition of Care [facility]) - Progression Note    Patient Details  Name: Nathan West MRN: [id number] Date of Birth: December 19, 1990  Transition of Care [facility]) CM/SW Contact  Ella Bodo, RN Phone Number: 05/04/2021, 3:13 PM  Clinical Narrative:    Pt back to ICU on 05/02/2021 due to agitation/delirium.  Chest tube replaced in IR today.  He continues on precedex ,librium, seroquel and CIWA protocol for withdrawal symptoms.  Will continue to follow for home needs as pt progresses.    Expected Discharge Plan: OP Rehab Barriers to Discharge: Continued Medical Work up  Expected Discharge Plan and Services Expected Discharge Plan: OP Rehab   Discharge Planning Services: CM Consult   Living arrangements for the past 2 months: Apartment                                       Social Determinants of Health (SDOH) Interventions    Readmission Risk Interventions     View : No data to display.         Reinaldo Raddle, RN, BSN  Trauma/Neuro ICU Case Manager [phone]
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
## 2021-05-04 NOTE — Plan of Care (Signed)
  Problem: Safety: Goal: Non-violent Restraint(s) Outcome: Progressing   Problem: Education: Goal: Knowledge of General Education information will improve Description: Including pain rating scale, medication(s)/side effects and non-pharmacologic comfort measures Outcome: Progressing   Problem: Health Behavior/Discharge Planning: Goal: Ability to manage health-related needs will improve Outcome: Progressing   Problem: Clinical Measurements: Goal: Will remain free from infection Outcome: Progressing   Problem: Activity: Goal: Risk for activity intolerance will decrease Outcome: Progressing   Problem: Nutrition: Goal: Adequate nutrition will be maintained Outcome: Progressing   Problem: Pain Managment: Goal: General experience of comfort will improve Outcome: Progressing
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
## 2021-05-04 NOTE — Progress Notes (Signed)
Pt's HR into the 120's, RR into the 50's with O2 sats dipping down to 89-90 on [place] at 4L. Trauma notified. [place] increased to 6L.
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
## 2021-05-04 NOTE — Procedures (Signed)
   Procedure Note  Date: 05/04/2021  Procedure: attempted placement of tube thoracostomy--right    Pre-op diagnosis: right pleural effusion  Post-op diagnosis: same  Surgeon: Diamantina Monks, MD  Anesthesia: local   EBL: <5cc procedural Drains/Implants: none Specimen: none  Description of procedure: Time-out was performed verifying correct patient, procedure, site, laterality, and signature of informed consent. Twenty cc's of local anesthetic was infiltrated into the tissues just over the fourth intercostal space.  A longitudinal incision was made parallel to the rib at the fourth intercostal space. This incision was deepened down through the muscle until the pleural cavity was entered. FIngersweep was unable to be performed so pleural entry obtained one level down. Able to perform a partial fingersweep, but unable to find a pathway to insert tube, so procedure was aborted. The site was dressed with gauze, and tape. The patient tolerated the procedure well. There were no complications. IR consult placed for image guided tube placement.    Diamantina Monks, MD General and Trauma Surgery [facility]
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
## 2021-05-04 NOTE — Progress Notes (Signed)
PT Cancellation Note  Patient Details Name: Nathan West MRN: [id number] DOB: 10/26/1990   Cancelled Treatment:    Reason Eval/Treat Not Completed: Medical issues which prohibited therapy; per RN pt still too agitated to participate, but improving on new meds.  Will continue attempts.    Elray Mcgregor 05/04/2021, 2:52 PM Sheran Lawless, PT Acute Rehabilitation Services Pager:[phone] Office:[phone] 05/04/2021

## 2021-05-04 NOTE — Consult Note (Signed)
Brief Psychiatry Consult Note  The patient was last seen by the psychiatry service on 5/2. Interim documentation by primary team and nursing staff has been reviewed. At this time, patient is intubated and sedated due to hemothorax. Will leave on list and f/u after extubation; please contact psych team if there are questions in interim.   - Will follow from afar until extubation.    Nathan West A Nathan West

## 2021-05-04 NOTE — Progress Notes (Signed)
Pt noted to have a temp of  100.4 prior to [facility] admin. Dr. Bedelia Person informed, no new orders. Pt's temp retaken 99.6.
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
## 2021-05-05 ENCOUNTER — Encounter (HOSPITAL_COMMUNITY): Payer: Self-pay

## 2021-05-05 ENCOUNTER — Encounter (HOSPITAL_COMMUNITY): Admission: EM | Disposition: E | Payer: Self-pay | Source: Home / Self Care

## 2021-05-05 ENCOUNTER — Other Ambulatory Visit: Payer: Self-pay

## 2021-05-05 ENCOUNTER — Inpatient Hospital Stay (HOSPITAL_COMMUNITY): Payer: Medicaid Other

## 2021-05-05 ENCOUNTER — Inpatient Hospital Stay (HOSPITAL_COMMUNITY): Payer: Medicaid Other | Admitting: Certified Registered Nurse Anesthetist

## 2021-05-05 DIAGNOSIS — D649 Anemia, unspecified: Secondary | ICD-10-CM

## 2021-05-05 DIAGNOSIS — J942 Hemothorax: Secondary | ICD-10-CM | POA: Diagnosis present

## 2021-05-05 DIAGNOSIS — S271XXA Traumatic hemothorax, initial encounter: Secondary | ICD-10-CM

## 2021-05-05 DIAGNOSIS — W3400XA Accidental discharge from unspecified firearms or gun, initial encounter: Secondary | ICD-10-CM

## 2021-05-05 DIAGNOSIS — J90 Pleural effusion, not elsewhere classified: Secondary | ICD-10-CM

## 2021-05-05 DIAGNOSIS — J9691 Respiratory failure, unspecified with hypoxia: Secondary | ICD-10-CM

## 2021-05-05 HISTORY — PX: VIDEO ASSISTED THORACOSCOPY: SHX5073

## 2021-05-05 LAB — CBC
HCT: 24.3 % — ABNORMAL LOW (ref 39.0–52.0)
HCT: 20.4 % — ABNORMAL LOW (ref 39.0–52.0)
Hemoglobin: 7.9 g/dL — ABNORMAL LOW (ref 13.0–17.0)
Hemoglobin: 7 g/dL — ABNORMAL LOW (ref 13.0–17.0)
MCH: 28.7 pg (ref 26.0–34.0)
MCH: 30.8 pg (ref 26.0–34.0)
MCHC: 32.5 g/dL (ref 30.0–36.0)
MCHC: 34.3 g/dL (ref 30.0–36.0)
MCV: 88.4 fL (ref 80.0–100.0)
MCV: 89.9 fL (ref 80.0–100.0)
Platelets: 505 10*3/uL — ABNORMAL HIGH (ref 150–400)
Platelets: 358 10*3/uL (ref 150–400)
RBC: 2.75 MIL/uL — ABNORMAL LOW (ref 4.22–5.81)
RBC: 2.27 MIL/uL — ABNORMAL LOW (ref 4.22–5.81)
RDW: 15.9 % — ABNORMAL HIGH (ref 11.5–15.5)
RDW: 15.9 % — ABNORMAL HIGH (ref 11.5–15.5)
WBC: 18.1 10*3/uL — ABNORMAL HIGH (ref 4.0–10.5)
WBC: 16 10*3/uL — ABNORMAL HIGH (ref 4.0–10.5)
nRBC: 0 % (ref 0.0–0.2)
nRBC: 0 % (ref 0.0–0.2)

## 2021-05-05 LAB — BASIC METABOLIC PANEL
Anion gap: 6 (ref 5–15)
Anion gap: 7 (ref 5–15)
BUN: 10 mg/dL (ref 6–20)
BUN: 8 mg/dL (ref 6–20)
CO2: 18 mmol/L — ABNORMAL LOW (ref 22–32)
CO2: 13 mmol/L — ABNORMAL LOW (ref 22–32)
Calcium: 6.6 mg/dL — ABNORMAL LOW (ref 8.9–10.3)
Calcium: 5.1 mg/dL — CL (ref 8.9–10.3)
Chloride: 116 mmol/L — ABNORMAL HIGH (ref 98–111)
Chloride: 121 mmol/L — ABNORMAL HIGH (ref 98–111)
Creatinine, Ser: 0.97 mg/dL (ref 0.61–1.24)
Creatinine, Ser: 0.57 mg/dL — ABNORMAL LOW (ref 0.61–1.24)
GFR, Estimated: >60 mL/min (ref >60)
GFR, Estimated: >60 mL/min (ref >60)
Glucose, Bld: 97 mg/dL (ref 70–99)
Glucose, Bld: 80 mg/dL (ref 70–99)
Potassium: 3.4 mmol/L — ABNORMAL LOW (ref 3.5–5.1)
Potassium: 3.3 mmol/L — ABNORMAL LOW (ref 3.5–5.1)
Sodium: 140 mmol/L (ref 135–145)
Sodium: 141 mmol/L (ref 135–145)

## 2021-05-05 LAB — PREPARE RBC (CROSSMATCH)

## 2021-05-05 LAB — TRIGLYCERIDES: Triglycerides: 772 mg/dL — ABNORMAL HIGH (ref <150)

## 2021-05-05 SURGERY — VIDEO ASSISTED THORACOSCOPY
Anesthesia: General | Site: Chest | Laterality: Right

## 2021-05-05 MED ORDER — FENTANYL CITRATE PF 50 MCG/ML IJ SOSY: 50.0000 ug | PREFILLED_SYRINGE | Freq: Once | INTRAMUSCULAR | Status: AC

## 2021-05-05 MED ORDER — FENTANYL CITRATE (PF) 250 MCG/5ML IJ SOLN
INTRAMUSCULAR | Status: AC
  Filled 2021-05-05: qty 5

## 2021-05-05 MED ORDER — LIDOCAINE 2% (20 MG/ML) 5 ML SYRINGE
INTRAMUSCULAR | Status: AC
  Filled 2021-05-05: qty 5

## 2021-05-05 MED ORDER — BUPIVACAINE HCL 0.5 % IJ SOLN
INTRAMUSCULAR | Status: DC | PRN
  Administered 2021-05-05: 20 mL

## 2021-05-05 MED ORDER — LACTATED RINGERS IV SOLN: INTRAVENOUS | Status: DC | PRN

## 2021-05-05 MED ORDER — SODIUM CHLORIDE 0.9 % IV SOLN
2.0000 g | Freq: Three times a day (TID) | INTRAVENOUS | Status: DC
  Administered 2021-05-05 – 2021-05-09 (×13): 2 g via INTRAVENOUS
  Filled 2021-05-05 (×13): qty 12.5

## 2021-05-05 MED ORDER — PROPOFOL 1000 MG/100ML IV EMUL
0.0000 ug/kg/min | INTRAVENOUS | Status: DC
  Administered 2021-05-06: 15 ug/kg/min via INTRAVENOUS
  Administered 2021-05-07 (×2): 10 ug/kg/min via INTRAVENOUS
  Administered 2021-05-08: 20 ug/kg/min via INTRAVENOUS
  Administered 2021-05-08 – 2021-05-09 (×2): 10 ug/kg/min via INTRAVENOUS
  Filled 2021-05-05 (×8): qty 100

## 2021-05-05 MED ORDER — FENTANYL 2500MCG IN NS 250ML (10MCG/ML) PREMIX INFUSION
INTRAVENOUS | Status: AC
  Administered 2021-05-05: 50 ug/h via INTRAVENOUS
  Filled 2021-05-05: qty 250

## 2021-05-05 MED ORDER — PHENYLEPHRINE HCL-NACL 20-0.9 MG/250ML-% IV SOLN
INTRAVENOUS | Status: DC | PRN
  Administered 2021-05-05: 50 ug/min via INTRAVENOUS

## 2021-05-05 MED ORDER — LORAZEPAM 1 MG PO TABS: 1.0000 mg | ORAL_TABLET | ORAL | Status: AC | PRN

## 2021-05-05 MED ORDER — ROCURONIUM BROMIDE 10 MG/ML (PF) SYRINGE
PREFILLED_SYRINGE | INTRAVENOUS | Status: AC
  Filled 2021-05-05: qty 20

## 2021-05-05 MED ORDER — PHENYLEPHRINE HCL-NACL 20-0.9 MG/250ML-% IV SOLN
0.0000 ug/min | INTRAVENOUS | Status: DC
  Administered 2021-05-05: 100 ug/min via INTRAVENOUS

## 2021-05-05 MED ORDER — POLYETHYLENE GLYCOL 3350 17 G PO PACK
17.0000 g | PACK | Freq: Every day | ORAL | Status: DC
  Administered 2021-05-06 – 2021-06-08 (×15): 17 g
  Filled 2021-05-05 (×18): qty 1

## 2021-05-05 MED ORDER — MIDAZOLAM HCL 2 MG/2ML IJ SOLN
INTRAMUSCULAR | Status: DC | PRN
  Administered 2021-05-05 (×2): 2 mg via INTRAVENOUS

## 2021-05-05 MED ORDER — IPRATROPIUM-ALBUTEROL 0.5-2.5 (3) MG/3ML IN SOLN
3.0000 mL | Freq: Three times a day (TID) | RESPIRATORY_TRACT | Status: DC
  Administered 2021-05-06 – 2021-06-15 (×119): 3 mL via RESPIRATORY_TRACT
  Filled 2021-05-05 (×121): qty 3

## 2021-05-05 MED ORDER — FENTANYL CITRATE (PF) 250 MCG/5ML IJ SOLN
INTRAMUSCULAR | Status: DC | PRN
  Administered 2021-05-05: 100 ug via INTRAVENOUS
  Administered 2021-05-05 (×3): 50 ug via INTRAVENOUS

## 2021-05-05 MED ORDER — MIDAZOLAM HCL 2 MG/2ML IJ SOLN
INTRAMUSCULAR | Status: AC
  Filled 2021-05-05: qty 2

## 2021-05-05 MED ORDER — POTASSIUM CHLORIDE 20 MEQ PO PACK
40.0000 meq | PACK | Freq: Once | ORAL | Status: AC
  Administered 2021-05-05: 40 meq
  Filled 2021-05-05: qty 2

## 2021-05-05 MED ORDER — FENTANYL 2500MCG IN NS 250ML (10MCG/ML) PREMIX INFUSION
50.0000 ug/h | INTRAVENOUS | Status: DC
  Administered 2021-05-07: 150 ug/h via INTRAVENOUS
  Administered 2021-05-07: 100 ug/h via INTRAVENOUS
  Administered 2021-05-08 – 2021-05-09 (×3): 150 ug/h via INTRAVENOUS
  Administered 2021-05-10: 175 ug/h via INTRAVENOUS
  Administered 2021-05-10 (×2): 150 ug/h via INTRAVENOUS
  Administered 2021-05-11: 175 ug/h via INTRAVENOUS
  Administered 2021-05-11: 100 ug/h via INTRAVENOUS
  Administered 2021-05-12 – 2021-05-15 (×8): 200 ug/h via INTRAVENOUS
  Filled 2021-05-05 (×17): qty 250

## 2021-05-05 MED ORDER — SODIUM CHLORIDE 0.9 % IV SOLN
4.0000 g | Freq: Once | INTRAVENOUS | Status: AC
  Administered 2021-05-05: 4 g via INTRAVENOUS
  Filled 2021-05-05: qty 40

## 2021-05-05 MED ORDER — DEXAMETHASONE SODIUM PHOSPHATE 10 MG/ML IJ SOLN
INTRAMUSCULAR | Status: DC | PRN
  Administered 2021-05-05: 10 mg via INTRAVENOUS

## 2021-05-05 MED ORDER — IOHEXOL 300 MG/ML  SOLN
100.0000 mL | Freq: Once | INTRAMUSCULAR | Status: AC | PRN
  Administered 2021-05-05: 100 mL via INTRAVENOUS

## 2021-05-05 MED ORDER — DEXAMETHASONE SODIUM PHOSPHATE 10 MG/ML IJ SOLN
INTRAMUSCULAR | Status: AC
  Filled 2021-05-05: qty 1

## 2021-05-05 MED ORDER — DEXMEDETOMIDINE (PRECEDEX) IN NS 20 MCG/5ML (4 MCG/ML) IV SYRINGE
PREFILLED_SYRINGE | INTRAVENOUS | Status: DC | PRN
  Administered 2021-05-05: 12 ug via INTRAVENOUS

## 2021-05-05 MED ORDER — DOCUSATE SODIUM 50 MG/5ML PO LIQD
100.0000 mg | Freq: Two times a day (BID) | ORAL | Status: DC
  Administered 2021-05-05 – 2021-06-14 (×60): 100 mg
  Filled 2021-05-05 (×60): qty 10

## 2021-05-05 MED ORDER — PROPOFOL 10 MG/ML IV BOLUS
INTRAVENOUS | Status: AC
  Filled 2021-05-05: qty 20

## 2021-05-05 MED ORDER — BUPIVACAINE LIPOSOME 1.3 % IJ SUSP
INTRAMUSCULAR | Status: AC
  Filled 2021-05-05: qty 20

## 2021-05-05 MED ORDER — SUCCINYLCHOLINE CHLORIDE 200 MG/10ML IV SOSY
PREFILLED_SYRINGE | INTRAVENOUS | Status: DC | PRN
  Administered 2021-05-05: 120 mg via INTRAVENOUS

## 2021-05-05 MED ORDER — SODIUM CHLORIDE 0.9 % IV SOLN
200.0000 mg | Freq: Every day | INTRAVENOUS | Status: AC
  Administered 2021-05-06 – 2021-05-09 (×4): 200 mg via INTRAVENOUS
  Filled 2021-05-05 (×5): qty 2

## 2021-05-05 MED ORDER — PHENYLEPHRINE 80 MCG/ML (10ML) SYRINGE FOR IV PUSH (FOR BLOOD PRESSURE SUPPORT)
PREFILLED_SYRINGE | INTRAVENOUS | Status: DC | PRN
  Administered 2021-05-05 (×5): 160 ug via INTRAVENOUS

## 2021-05-05 MED ORDER — FENTANYL BOLUS VIA INFUSION
50.0000 ug | INTRAVENOUS | Status: DC | PRN
  Administered 2021-05-10 (×3): 50 ug via INTRAVENOUS
  Administered 2021-05-12: 75 ug via INTRAVENOUS
  Administered 2021-05-12: 50 ug via INTRAVENOUS
  Administered 2021-05-12: 75 ug via INTRAVENOUS
  Administered 2021-05-12: 50 ug via INTRAVENOUS
  Administered 2021-05-12: 75 ug via INTRAVENOUS
  Administered 2021-05-12: 50 ug via INTRAVENOUS
  Administered 2021-05-13 (×4): 100 ug via INTRAVENOUS
  Administered 2021-05-13: 75 ug via INTRAVENOUS
  Administered 2021-05-13 (×2): 100 ug via INTRAVENOUS
  Filled 2021-05-05: qty 100

## 2021-05-05 MED ORDER — PROPOFOL 10 MG/ML IV BOLUS
INTRAVENOUS | Status: DC | PRN
  Administered 2021-05-05: 150 mg via INTRAVENOUS

## 2021-05-05 MED ORDER — PROPOFOL 1000 MG/100ML IV EMUL
INTRAVENOUS | Status: AC
  Administered 2021-05-05: 10 ug/kg/min via INTRAVENOUS
  Filled 2021-05-05: qty 100

## 2021-05-05 MED ORDER — ONDANSETRON HCL 4 MG/2ML IJ SOLN
INTRAMUSCULAR | Status: DC | PRN
  Administered 2021-05-05: 4 mg via INTRAVENOUS

## 2021-05-05 MED ORDER — PHENYLEPHRINE 80 MCG/ML (10ML) SYRINGE FOR IV PUSH (FOR BLOOD PRESSURE SUPPORT)
PREFILLED_SYRINGE | INTRAVENOUS | Status: AC
  Filled 2021-05-05: qty 10

## 2021-05-05 MED ORDER — POTASSIUM CHLORIDE 10 MEQ/50ML IV SOLN
10.0000 meq | INTRAVENOUS | Status: AC
  Administered 2021-05-05 (×2): 10 meq via INTRAVENOUS
  Filled 2021-05-05 (×2): qty 50

## 2021-05-05 MED ORDER — CALCIUM CHLORIDE 10 % IV SOLN
INTRAVENOUS | Status: AC
  Filled 2021-05-05: qty 10

## 2021-05-05 MED ORDER — CALCIUM CHLORIDE 10 % IV SOLN
INTRAVENOUS | Status: DC | PRN
Start: 2021-05-05 — End: 2021-05-05
  Administered 2021-05-05 (×2): 500 mg via INTRAVENOUS

## 2021-05-05 MED ORDER — POTASSIUM CHLORIDE 10 MEQ/50ML IV SOLN: 10.0000 meq | INTRAVENOUS | Status: DC

## 2021-05-05 MED ORDER — ALBUMIN HUMAN 5 % IV SOLN
12.5000 g | Freq: Once | INTRAVENOUS | Status: AC
  Administered 2021-05-05: 12.5 g via INTRAVENOUS
  Filled 2021-05-05: qty 250

## 2021-05-05 MED ORDER — LORAZEPAM 2 MG/ML IJ SOLN
1.0000 mg | INTRAMUSCULAR | Status: AC | PRN
  Administered 2021-05-05 (×2): 4 mg via INTRAVENOUS
  Filled 2021-05-05 (×2): qty 2

## 2021-05-05 MED ORDER — SODIUM CHLORIDE 0.9 % IV SOLN: INTRAVENOUS | Status: DC | PRN

## 2021-05-05 MED ORDER — BUPIVACAINE HCL (PF) 0.5 % IJ SOLN
INTRAMUSCULAR | Status: AC
  Filled 2021-05-05: qty 30

## 2021-05-05 MED ORDER — VASOPRESSIN 20 UNIT/ML IV SOLN
INTRAVENOUS | Status: AC
  Filled 2021-05-05: qty 1

## 2021-05-05 MED ORDER — 0.9 % SODIUM CHLORIDE (POUR BTL) OPTIME
TOPICAL | Status: DC | PRN
  Administered 2021-05-05: 2000 mL

## 2021-05-05 MED ORDER — SODIUM CHLORIDE 0.9% IV SOLUTION: Freq: Once | INTRAVENOUS | Status: DC

## 2021-05-05 MED ORDER — THIAMINE HCL 100 MG PO TABS
100.0000 mg | ORAL_TABLET | Freq: Every day | ORAL | Status: DC
  Administered 2021-05-10 – 2021-06-15 (×37): 100 mg
  Filled 2021-05-05 (×37): qty 1

## 2021-05-05 MED ORDER — ROCURONIUM BROMIDE 10 MG/ML (PF) SYRINGE
PREFILLED_SYRINGE | INTRAVENOUS | Status: DC | PRN
  Administered 2021-05-05 (×2): 50 mg via INTRAVENOUS
  Administered 2021-05-05: 100 mg via INTRAVENOUS

## 2021-05-05 SURGICAL SUPPLY — 97 items
ADH SKN CLS APL DERMABOND .7 (GAUZE/BANDAGES/DRESSINGS) ×1
APL SWBSTK 6 STRL LF DISP (MISCELLANEOUS)
APPLICATOR COTTON TIP 6 STRL (MISCELLANEOUS) IMPLANT
APPLICATOR COTTON TIP 6IN STRL (MISCELLANEOUS) IMPLANT
APPLIER CLIP ROT 10 11.4 M/L (STAPLE)
APR CLP MED LRG 11.4X10 (STAPLE)
BAG SPEC RTRVL LRG 6X4 10 (ENDOMECHANICALS)
BLADE CLIPPER SURG (BLADE) ×3 IMPLANT
BLADE SURG 11 STRL SS (BLADE) ×1 IMPLANT
CANISTER SUCT 3000ML PPV (MISCELLANEOUS) ×3 IMPLANT
CATH THORACIC 28FR (CATHETERS) ×1 IMPLANT
CATH THORACIC 28FR RT ANG (CATHETERS) IMPLANT
CATH THORACIC 36FR (CATHETERS) IMPLANT
CATH THORACIC 36FR RT ANG (CATHETERS) IMPLANT
CLIP APPLIE ROT 10 11.4 M/L (STAPLE) IMPLANT
CLIP VESOCCLUDE MED 6/CT (CLIP) IMPLANT
CNTNR URN SCR LID CUP LEK RST (MISCELLANEOUS) ×4 IMPLANT
CONN Y 3/8X3/8X3/8  BEN (MISCELLANEOUS) ×2
CONN Y 3/8X3/8X3/8 BEN (MISCELLANEOUS) ×2 IMPLANT
CONT SPEC 4OZ STRL OR WHT (MISCELLANEOUS) ×4
COVER SURGICAL LIGHT HANDLE (MISCELLANEOUS) ×3 IMPLANT
DEFOGGER SCOPE WARMER CLEARIFY (MISCELLANEOUS) ×1 IMPLANT
DERMABOND ADVANCED (GAUZE/BANDAGES/DRESSINGS) ×1
DERMABOND ADVANCED .7 DNX12 (GAUZE/BANDAGES/DRESSINGS) IMPLANT
DRAIN CHANNEL 28F RND 3/8 FF (WOUND CARE) IMPLANT
DRAIN CHANNEL 32F RND 10.7 FF (WOUND CARE) IMPLANT
DRAPE CV SPLIT W-CLR ANES SCRN (DRAPES) ×3 IMPLANT
DRAPE ORTHO SPLIT 77X108 STRL (DRAPES) ×2
DRAPE SURG ORHT 6 SPLT 77X108 (DRAPES) ×2 IMPLANT
DRAPE WARM FLUID 44X44 (DRAPES) ×3 IMPLANT
ELECT BLADE 4.0 EZ CLEAN MEGAD (MISCELLANEOUS) ×2
ELECT BLADE 6.5 EXT (BLADE) ×3 IMPLANT
ELECT REM PT RETURN 9FT ADLT (ELECTROSURGICAL) ×2
ELECTRODE BLDE 4.0 EZ CLN MEGD (MISCELLANEOUS) IMPLANT
ELECTRODE REM PT RTRN 9FT ADLT (ELECTROSURGICAL) ×2 IMPLANT
GAUZE 4X4 16PLY ~~LOC~~+RFID DBL (SPONGE) ×3 IMPLANT
GAUZE SPONGE 4X4 12PLY STRL (GAUZE/BANDAGES/DRESSINGS) ×3 IMPLANT
GLOVE SURG MICRO LTX SZ7.5 (GLOVE) ×3 IMPLANT
GLOVE SURG SIGNA 7.5 PF LTX (GLOVE) ×6 IMPLANT
GOWN STRL REUS W/ TWL LRG LVL3 (GOWN DISPOSABLE) ×4 IMPLANT
GOWN STRL REUS W/ TWL XL LVL3 (GOWN DISPOSABLE) ×2 IMPLANT
GOWN STRL REUS W/TWL LRG LVL3 (GOWN DISPOSABLE) ×4
GOWN STRL REUS W/TWL XL LVL3 (GOWN DISPOSABLE) ×2
HEMOSTAT SURGICEL 2X14 (HEMOSTASIS) IMPLANT
IV CATH 22GX1 FEP (IV SOLUTION) IMPLANT
KIT BASIN OR (CUSTOM PROCEDURE TRAY) ×3 IMPLANT
KIT SUCTION CATH 14FR (SUCTIONS) ×3 IMPLANT
KIT TURNOVER KIT B (KITS) ×3 IMPLANT
NDL HYPO 25GX1X1/2 BEV (NEEDLE) ×2 IMPLANT
NEEDLE HYPO 25GX1X1/2 BEV (NEEDLE) ×2 IMPLANT
NS IRRIG 1000ML POUR BTL (IV SOLUTION) ×6 IMPLANT
PACK CHEST (CUSTOM PROCEDURE TRAY) ×3 IMPLANT
PAD ARMBOARD 7.5X6 YLW CONV (MISCELLANEOUS) ×6 IMPLANT
POUCH ENDO CATCH II 15MM (MISCELLANEOUS) IMPLANT
POUCH SPECIMEN RETRIEVAL 10MM (ENDOMECHANICALS) IMPLANT
SEALANT PROGEL (MISCELLANEOUS) IMPLANT
SEALANT SURG COSEAL 4ML (VASCULAR PRODUCTS) IMPLANT
SEALANT SURG COSEAL 8ML (VASCULAR PRODUCTS) IMPLANT
SOL ANTI FOG 6CC (MISCELLANEOUS) ×2 IMPLANT
SOLUTION ANTI FOG 6CC (MISCELLANEOUS) ×1
SPECIMEN JAR MEDIUM (MISCELLANEOUS) ×3 IMPLANT
SPONGE INTESTINAL PEANUT (DISPOSABLE) IMPLANT
SPONGE T-LAP 18X18 ~~LOC~~+RFID (SPONGE) ×12 IMPLANT
SPONGE T-LAP 4X18 ~~LOC~~+RFID (SPONGE) ×3 IMPLANT
SPONGE TONSIL TAPE 1 RFD (DISPOSABLE) ×3 IMPLANT
STOPCOCK 4 WAY LG BORE MALE ST (IV SETS) ×3 IMPLANT
SUT PROLENE 4 0 RB 1 (SUTURE)
SUT PROLENE 4-0 RB1 .5 CRCL 36 (SUTURE) IMPLANT
SUT SILK  1 MH (SUTURE) ×2
SUT SILK 1 MH (SUTURE) ×4 IMPLANT
SUT SILK 2 0SH CR/8 30 (SUTURE) IMPLANT
SUT SILK 3 0SH CR/8 30 (SUTURE) IMPLANT
SUT VIC AB 1 CTX 36 (SUTURE)
SUT VIC AB 1 CTX36XBRD ANBCTR (SUTURE) IMPLANT
SUT VIC AB 2-0 CT1 27 (SUTURE) ×2
SUT VIC AB 2-0 CT1 TAPERPNT 27 (SUTURE) IMPLANT
SUT VIC AB 2-0 CTX 36 (SUTURE) IMPLANT
SUT VIC AB 2-0 UR6 27 (SUTURE) IMPLANT
SUT VIC AB 3-0 MH 27 (SUTURE) IMPLANT
SUT VIC AB 3-0 SH 27 (SUTURE) ×4
SUT VIC AB 3-0 SH 27X BRD (SUTURE) IMPLANT
SUT VIC AB 3-0 X1 27 (SUTURE) ×2 IMPLANT
SUT VICRYL 2 TP 1 (SUTURE) IMPLANT
SYR 10ML LL (SYRINGE) ×3 IMPLANT
SYR 20ML LL LF (SYRINGE) IMPLANT
SYR 50ML LL SCALE MARK (SYRINGE) ×3 IMPLANT
SYSTEM SAHARA CHEST DRAIN ATS (WOUND CARE) ×3 IMPLANT
TAPE CLOTH 4X10 WHT NS (GAUZE/BANDAGES/DRESSINGS) ×3 IMPLANT
TAPE CLOTH SURG 4X10 WHT LF (GAUZE/BANDAGES/DRESSINGS) ×1 IMPLANT
TIP APPLICATOR SPRAY EXTEND 16 (VASCULAR PRODUCTS) IMPLANT
TOWEL GREEN STERILE (TOWEL DISPOSABLE) ×3 IMPLANT
TOWEL GREEN STERILE FF (TOWEL DISPOSABLE) ×3 IMPLANT
TRAY FOLEY MTR SLVR 16FR STAT (SET/KITS/TRAYS/PACK) ×3 IMPLANT
TROCAR XCEL BLADELESS 5X75MML (TROCAR) ×3 IMPLANT
TROCAR XCEL NON-BLD 5MMX100MML (ENDOMECHANICALS) IMPLANT
TUBING EXTENTION W/L.L. (IV SETS) ×3 IMPLANT
WATER STERILE IRR 1000ML POUR (IV SOLUTION) ×6 IMPLANT

## 2021-05-05 NOTE — Progress Notes (Signed)
MD aware of patients sustained respiratory rate in the 40s-50s. Patient's breathing is labored, the MD has been made aware.   Beryl Meager, RN

## 2021-05-05 NOTE — Brief Op Note (Addendum)
04/06/2021 - 05/23/2021  4:33 PM  PATIENT:  Nathan West  31 y.o. male  PRE-OPERATIVE DIAGNOSIS:  Retained right hemothorax and pleural effusion  POST-OPERATIVE DIAGNOSIS:  Retained right hemothorax and pleural effusion  PROCEDURE:  RIGHT VIDEO ASSISTED THORACOSCOPY, DRAINAGE of RETAINED HEMOTHORAX/PLEURAL EFFUSION, DECORTICATION  SURGEON:  Surgeon(s) and Role:    Lightfoot, Eliezer Lofts, MD - Primary  PHYSICIAN ASSISTANT: 1. Gershon Crane PA-C 2. Oluwadarasimi Favor PA-C  ANESTHESIA:   general  EBL: Minimal  BLOOD ADMINISTERED:none  DRAINS:  28 Chest tube placed in the right pleural space    LOCAL MEDICATIONS USED:  MARCAINE     SPECIMEN:  Source of Specimen:  Right pleural peel  DISPOSITION OF SPECIMEN:   Culture  COUNTS CORRECT:  YES  DICTATION: .Dragon Dictation  PLAN OF CARE: Admit to inpatient   PATIENT DISPOSITION:  He will remain intubated and return to 4N ICU   Delay start of Pharmacological VTE agent (>24hrs) due to surgical blood loss or risk of bleeding: no

## 2021-05-05 NOTE — Progress Notes (Signed)
     [address]       [place] [address]             [phone]       Full note to follow CT from perc tube reviewed.  Small anterior and apical fluid collection.  Mostly lung consolidation and bruising on my read.  Will order CT with IV contrast for better view.  Sim Choquette Keane Scrape

## 2021-05-05 NOTE — Anesthesia Procedure Notes (Signed)
Procedure Name: Intubation Date/Time: 05/31/2021 5:00 PM Performed by: Carolan Clines, CRNA Pre-anesthesia Checklist: Patient identified, Emergency Drugs available, Suction available and Patient being monitored Patient Re-evaluated:Patient Re-evaluated prior to induction Oxygen Delivery Method: Circle System Utilized Preoxygenation: Pre-oxygenation with 100% oxygen Induction Type: IV induction Laryngoscope Size: Mac and 4 Grade View: Grade I Tube type: Oral Tube size: 8.0 mm Number of attempts: 1 Airway Equipment and Method: Stylet Placement Confirmation: ETT inserted through vocal cords under direct vision, positive ETCO2 and breath sounds checked- equal and bilateral Secured at: 25 cm Tube secured with: Tape Dental Injury: Teeth and Oropharynx as per pre-operative assessment  Comments: DLT switched to 8.0 oral ETT via DL.

## 2021-05-05 NOTE — Anesthesia Postprocedure Evaluation (Signed)
Anesthesia Post Note  Patient: Nathan West  Procedure(s) Performed: VIDEO ASSISTED THORACOSCOPY (Right: Chest)     Patient location during evaluation: SICU Anesthesia Type: General Level of consciousness: sedated Pain management: pain level controlled Vital Signs Assessment: post-procedure vital signs reviewed and stable Respiratory status: patient remains intubated per anesthesia plan Cardiovascular status: stable Postop Assessment: no apparent nausea or vomiting Anesthetic complications: no   No notable events documented.  Last Vitals:  Vitals:   05/14/2021 1300 05/27/2021 1400  BP: (!) 105/58 96/64  Pulse: (!) 107 74  Resp: (!) 31 (!) 38  Temp:    SpO2: 96% 98%    Last Pain:  Vitals:   05/31/2021 0800  TempSrc: Axillary  PainSc:                  Nathan West

## 2021-05-05 NOTE — Consult Note (Signed)
Brief Psychiatry Consult Note  The patient was last seen by the psychiatry service on 5/3. Interim documentation by primary team and nursing staff has been reviewed. At this time, patient is acutely medically ill and unable to communicate; has generally been unable to communicate for several days. We will upgrade to IV thiamine in light of prolonged AMS with EtOH w/d although pt with minimal risk factors for Wernicke's (had been eating outside food before rehab, normocytosis, etc). Please feel free to reconsult when pt's medical status improves.   - starting IV thiamine 200 mg x5 d followed by oral.  - otherwise no changes  We will sign off at this time. This has been communicated to the primary team. If issues arise in the future, don't hesitate to reconsult the Psychiatry Inpatient Consult Service.   Keiran Gaffey A Shaelin Lalley
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
## 2021-05-05 NOTE — Transfer of Care (Signed)
Immediate Anesthesia Transfer of Care Note  Patient: Nathan West  Procedure(s) Performed: VIDEO ASSISTED THORACOSCOPY (Right: Chest)  Patient Location: ICU  Anesthesia Type:General  Level of Consciousness: sedated and Patient remains intubated per anesthesia plan  Airway & Oxygen Therapy: Patient remains intubated per anesthesia plan and Patient placed on Ventilator (see vital sign flow sheet for setting)  Post-op Assessment: Report given to RN and Post -op Vital signs reviewed and stable on phenylephrine infusion.  Post vital signs: Reviewed and stable  Last Vitals:  Vitals Value Taken Time  BP 110/65 05/03/2021 1745  Temp    Pulse 87 05/03/2021 1745  Resp 18 05/06/2021 1745  SpO2 100 % 05/14/2021 1745  Vitals shown include unvalidated device data.  Last Pain:  Vitals:   05/19/2021 0800  TempSrc: Axillary  PainSc:       Patients Stated Pain Goal: 2 (05/02/21 2311)  Complications: No notable events documented.

## 2021-05-05 NOTE — Progress Notes (Signed)
OT Cancellation Note  Patient Details Name: Nathan West MRN: [id number] DOB: 12-17-1990   Cancelled Treatment:    Reason Eval/Treat Not Completed: Patient not medically ready;Other (comment) (per conversation with nsg, when pt not sedated he becomes agitated and difficult to manage, requiring restraints at all times; will check back later time)  [facility] 05/28/2021, 1:07 PM Maurie Boettcher, OT/L   Acute OT Clinical Specialist Acute Rehabilitation Services Pager [phone] Office [phone]

## 2021-05-05 NOTE — Anesthesia Procedure Notes (Addendum)
Procedure Name: Intubation Date/Time: 05/13/2021 3:13 PM Performed by: Carolan Clines, CRNA Pre-anesthesia Checklist: Patient identified, Emergency Drugs available, Suction available and Patient being monitored Patient Re-evaluated:Patient Re-evaluated prior to induction Oxygen Delivery Method: Circle System Utilized Preoxygenation: Pre-oxygenation with 100% oxygen Induction Type: IV induction Laryngoscope Size: Mac and 4 Grade View: Grade I Tube type: Oral Endobronchial tube: Left and 37 Fr Number of attempts: 1 Airway Equipment and Method: Stylet and Oral airway Placement Confirmation: ETT inserted through vocal cords under direct vision, positive ETCO2 and breath sounds checked- equal and bilateral Tube secured with: Tape Dental Injury: Teeth and Oropharynx as per pre-operative assessment

## 2021-05-05 NOTE — Progress Notes (Signed)
PT Cancellation Note  Patient Details Name: Nathan West MRN: [id number] DOB: Nov 01, 1990   Cancelled Treatment:    Reason Eval/Treat Not Completed: Medical issues which prohibited therapy; RN reports pt verbally aggressive and not able to untie restraints safely.  Will attempt later as schedule permits.    Elray Mcgregor 05/30/2021, 9:41 AM Sheran Lawless, PT Acute Rehabilitation Services Pager:[phone] Office:[phone] 05/17/2021

## 2021-05-06 ENCOUNTER — Inpatient Hospital Stay (HOSPITAL_COMMUNITY): Payer: Medicaid Other

## 2021-05-06 ENCOUNTER — Encounter (HOSPITAL_COMMUNITY): Payer: Self-pay | Admitting: Thoracic Surgery (Cardiothoracic Vascular Surgery)

## 2021-05-06 LAB — BASIC METABOLIC PANEL
Anion gap: 8 (ref 5–15)
BUN: 12 mg/dL (ref 6–20)
CO2: 22 mmol/L (ref 22–32)
Calcium: 8.6 mg/dL — ABNORMAL LOW (ref 8.9–10.3)
Chloride: 110 mmol/L (ref 98–111)
Creatinine, Ser: 0.79 mg/dL (ref 0.61–1.24)
GFR, Estimated: >60 mL/min (ref >60)
Glucose, Bld: 153 mg/dL — ABNORMAL HIGH (ref 70–99)
Potassium: 4.5 mmol/L (ref 3.5–5.1)
Sodium: 140 mmol/L (ref 135–145)

## 2021-05-06 LAB — CBC
HCT: 28.3 % — ABNORMAL LOW (ref 39.0–52.0)
HCT: 27.8 % — ABNORMAL LOW (ref 39.0–52.0)
Hemoglobin: 9.3 g/dL — ABNORMAL LOW (ref 13.0–17.0)
Hemoglobin: 9 g/dL — ABNORMAL LOW (ref 13.0–17.0)
MCH: 28.9 pg (ref 26.0–34.0)
MCH: 28.8 pg (ref 26.0–34.0)
MCHC: 32.9 g/dL (ref 30.0–36.0)
MCHC: 32.4 g/dL (ref 30.0–36.0)
MCV: 87.9 fL (ref 80.0–100.0)
MCV: 88.8 fL (ref 80.0–100.0)
Platelets: 562 10*3/uL — ABNORMAL HIGH (ref 150–400)
Platelets: 584 10*3/uL — ABNORMAL HIGH (ref 150–400)
RBC: 3.22 MIL/uL — ABNORMAL LOW (ref 4.22–5.81)
RBC: 3.13 MIL/uL — ABNORMAL LOW (ref 4.22–5.81)
RDW: 16.6 % — ABNORMAL HIGH (ref 11.5–15.5)
RDW: 16.7 % — ABNORMAL HIGH (ref 11.5–15.5)
WBC: 21.8 10*3/uL — ABNORMAL HIGH (ref 4.0–10.5)
WBC: 20.7 10*3/uL — ABNORMAL HIGH (ref 4.0–10.5)
nRBC: 0.1 % (ref 0.0–0.2)
nRBC: 0.1 % (ref 0.0–0.2)

## 2021-05-06 LAB — BPAM RBC
Blood Product Expiration Date: 202305112359
Blood Product Expiration Date: 202305112359
Blood Product Expiration Date: 202306012359
Blood Product Expiration Date: 202306012359
Blood Product Expiration Date: 202306012359
ISSUE DATE / TIME: 202305021803
ISSUE DATE / TIME: 202305030118
ISSUE DATE / TIME: 202305030457
ISSUE DATE / TIME: 202305041537
ISSUE DATE / TIME: 202305041537
Unit Type and Rh: 5100
Unit Type and Rh: 5100
Unit Type and Rh: 5100
Unit Type and Rh: 5100
Unit Type and Rh: 5100

## 2021-05-06 LAB — TYPE AND SCREEN
ABO/RH(D): O POS
Antibody Screen: NEGATIVE
Unit division: 0
Unit division: 0
Unit division: 0
Unit division: 0
Unit division: 0

## 2021-05-06 LAB — PHOSPHORUS
Phosphorus: 3.9 mg/dL (ref 2.5–4.6)
Phosphorus: 3.1 mg/dL (ref 2.5–4.6)

## 2021-05-06 LAB — GLUCOSE, CAPILLARY
Glucose-Capillary: 141 mg/dL — ABNORMAL HIGH (ref 70–99)
Glucose-Capillary: 143 mg/dL — ABNORMAL HIGH (ref 70–99)
Glucose-Capillary: 151 mg/dL — ABNORMAL HIGH (ref 70–99)

## 2021-05-06 LAB — MAGNESIUM
Magnesium: 2.3 mg/dL (ref 1.7–2.4)
Magnesium: 2.3 mg/dL (ref 1.7–2.4)

## 2021-05-06 LAB — TRIGLYCERIDES: Triglycerides: 165 mg/dL — ABNORMAL HIGH (ref <150)

## 2021-05-06 MED ORDER — PROSOURCE TF PO LIQD
45.0000 mL | Freq: Two times a day (BID) | ORAL | Status: DC
  Administered 2021-05-06: 45 mL
  Filled 2021-05-06: qty 45

## 2021-05-06 MED ORDER — ENOXAPARIN SODIUM 100 MG/ML IJ SOSY
90.0000 mg | PREFILLED_SYRINGE | Freq: Two times a day (BID) | INTRAMUSCULAR | Status: DC
Start: 2021-05-06 — End: 2021-05-21
  Administered 2021-05-06 – 2021-05-20 (×30): 90 mg via SUBCUTANEOUS
  Filled 2021-05-06 (×31): qty 0.9

## 2021-05-06 MED ORDER — PIVOT 1.5 CAL PO LIQD
1000.0000 mL | ORAL | Status: DC
  Administered 2021-05-06 – 2021-06-06 (×38): 1000 mL

## 2021-05-06 MED ORDER — VITAL HIGH PROTEIN PO LIQD: 1000.0000 mL | ORAL | Status: DC

## 2021-05-06 MED ORDER — ORAL CARE MOUTH RINSE
15.0000 mL | OROMUCOSAL | Status: DC
  Administered 2021-05-06 – 2021-06-15 (×401): 15 mL via OROMUCOSAL

## 2021-05-06 MED ORDER — CHLORHEXIDINE GLUCONATE 0.12% ORAL RINSE (MEDLINE KIT)
15.0000 mL | Freq: Two times a day (BID) | OROMUCOSAL | Status: DC
  Administered 2021-05-06 – 2021-06-15 (×80): 15 mL via OROMUCOSAL
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
## 2021-05-06 NOTE — Progress Notes (Signed)
   Subjective/Interval: Intubated and sedated this AM s/p VATS procedure.     Objective:   VITALS:   Vitals:   05/06/21 0400 05/06/21 0500 05/06/21 0600 05/06/21 0700  BP: 119/77 114/76 115/76 113/72  Pulse: 79 79 79 78  Resp: (!) 21 (!) 21 (!) 22 (!) 21  Temp: 98.5 F (36.9 C)     TempSrc: Oral     SpO2: 100% 100% 100% 100%  Weight:      Height:       Left arm is in a dressing which is clean dry and intact.  2+ radial pulse.  Fingers warm and well-perfused. Unable to comply with exam.  Compartments are compressible dorsally and volarly.   Lab Results  Component Value Date   WBC 21.8 (H) 05/06/2021   HGB 9.3 (L) 05/06/2021   HCT 28.3 (L) 05/06/2021   MCV 87.9 05/06/2021   PLT 562 (H) 05/06/2021     Assessment/Plan: Status post left both bone forearm fracture fixation with fasciotomy closure, removal of Ex-Fix, radial nerve exploration 4/26  - Patient to continue to work with occupational therapy when he is able - DVT ppx - SCDs, ambulation, Lovenox for contralateral upper extremity DVT - NWB operative extremity, he may begin gentle range of motion about the elbow and wrist/hand as tolerated, no lifting with left arm when he is able -Plan for xrays, dressing change next week if patient remains in [place] will continue to follow  Nathan West 05/06/2021, 7:30 AM
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
## 2021-05-06 NOTE — Progress Notes (Signed)
PT Cancellation Note  Patient Details Name: Nathan West MRN: [id number] DOB: Jul 14, 1990   Cancelled Treatment:    Reason Eval/Treat Not Completed: Medical issues which prohibited therapy; patient intubated and sedated post procedure. Will attempt another day.   Elray Mcgregor 05/06/2021, 9:49 AM Sheran Lawless, PT Acute Rehabilitation Services [phone] Office:[phone] 05/06/2021
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
## 2021-05-06 NOTE — Procedures (Signed)
Cortrak  Person Inserting Tube:  Tashay Bozich D, RD Tube Type:  Cortrak - 43 inches Tube Size:  10 Tube Location:  Left nare Secured by: Bridle Technique Used to Measure Tube Placement:  Marking at nare/corner of mouth Cortrak Secured At:  72 cm  Cortrak Tube Team Note:  Consult received to place a Cortrak feeding tube.   X-ray is required, abdominal x-ray has been ordered by the Cortrak team. Please confirm tube placement before using the Cortrak tube.   If the tube becomes dislodged please keep the tube and contact the Cortrak team at [email or web address] ([email or web address]) for replacement.  If after hours and replacement cannot be delayed, place a NG tube and confirm placement with an abdominal x-ray.   Rotunda Worden, RD, LDN Clinical Dietitian RD pager # available in AMION  After hours/weekend pager # available in AMION   

## 2021-05-07 ENCOUNTER — Inpatient Hospital Stay (HOSPITAL_COMMUNITY): Payer: Medicaid Other

## 2021-05-07 LAB — MAGNESIUM
Magnesium: 2.2 mg/dL (ref 1.7–2.4)
Magnesium: 2.1 mg/dL (ref 1.7–2.4)

## 2021-05-07 LAB — CBC
HCT: 27.4 % — ABNORMAL LOW (ref 39.0–52.0)
Hemoglobin: 8.6 g/dL — ABNORMAL LOW (ref 13.0–17.0)
MCH: 28.5 pg (ref 26.0–34.0)
MCHC: 31.4 g/dL (ref 30.0–36.0)
MCV: 90.7 fL (ref 80.0–100.0)
Platelets: 611 10*3/uL — ABNORMAL HIGH (ref 150–400)
RBC: 3.02 MIL/uL — ABNORMAL LOW (ref 4.22–5.81)
RDW: 16.5 % — ABNORMAL HIGH (ref 11.5–15.5)
WBC: 14.9 10*3/uL — ABNORMAL HIGH (ref 4.0–10.5)
nRBC: 0.2 % (ref 0.0–0.2)

## 2021-05-07 LAB — GLUCOSE, CAPILLARY
Glucose-Capillary: 155 mg/dL — ABNORMAL HIGH (ref 70–99)
Glucose-Capillary: 158 mg/dL — ABNORMAL HIGH (ref 70–99)
Glucose-Capillary: 109 mg/dL — ABNORMAL HIGH (ref 70–99)
Glucose-Capillary: 120 mg/dL — ABNORMAL HIGH (ref 70–99)
Glucose-Capillary: 125 mg/dL — ABNORMAL HIGH (ref 70–99)
Glucose-Capillary: 137 mg/dL — ABNORMAL HIGH (ref 70–99)

## 2021-05-07 LAB — CULTURE, RESPIRATORY W GRAM STAIN
Culture: NORMAL
Gram Stain: NONE SEEN

## 2021-05-07 LAB — PHOSPHORUS
Phosphorus: 1.8 mg/dL — ABNORMAL LOW (ref 2.5–4.6)
Phosphorus: 3.9 mg/dL (ref 2.5–4.6)

## 2021-05-07 LAB — BASIC METABOLIC PANEL
Anion gap: 5 (ref 5–15)
BUN: 15 mg/dL (ref 6–20)
CO2: 24 mmol/L (ref 22–32)
Calcium: 8 mg/dL — ABNORMAL LOW (ref 8.9–10.3)
Chloride: 115 mmol/L — ABNORMAL HIGH (ref 98–111)
Creatinine, Ser: 0.84 mg/dL (ref 0.61–1.24)
GFR, Estimated: >60 mL/min (ref >60)
Glucose, Bld: 140 mg/dL — ABNORMAL HIGH (ref 70–99)
Potassium: 3.5 mmol/L (ref 3.5–5.1)
Sodium: 144 mmol/L (ref 135–145)

## 2021-05-07 MED ORDER — POTASSIUM PHOSPHATES 15 MMOLE/5ML IV SOLN
30.0000 mmol | Freq: Once | INTRAVENOUS | Status: AC
  Administered 2021-05-07: 30 mmol via INTRAVENOUS
  Filled 2021-05-07: qty 10

## 2021-05-07 NOTE — Progress Notes (Signed)
PT Cancellation Note  Patient Details Name: Nathan West MRN: [id number] DOB: May 25, 1990   Cancelled Treatment:    Reason Eval/Treat Not Completed: Medical issues which prohibited therapy. Pt remains intubated and sedated, agitated earlier in the day with attempts to wean sedation for PSV trials. PT will follow up when the patient is more alert and better able to participate in evaluation.   Arlyss Gandy 05/07/2021, 12:45 PM
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
## 2021-05-08 LAB — CBC
HCT: 25.6 % — ABNORMAL LOW (ref 39.0–52.0)
Hemoglobin: 8 g/dL — ABNORMAL LOW (ref 13.0–17.0)
MCH: 29.2 pg (ref 26.0–34.0)
MCHC: 31.3 g/dL (ref 30.0–36.0)
MCV: 93.4 fL (ref 80.0–100.0)
Platelets: 623 10*3/uL — ABNORMAL HIGH (ref 150–400)
RBC: 2.74 MIL/uL — ABNORMAL LOW (ref 4.22–5.81)
RDW: 16.7 % — ABNORMAL HIGH (ref 11.5–15.5)
WBC: 11.4 10*3/uL — ABNORMAL HIGH (ref 4.0–10.5)
nRBC: 0.3 % — ABNORMAL HIGH (ref 0.0–0.2)

## 2021-05-08 LAB — BASIC METABOLIC PANEL
Anion gap: 6 (ref 5–15)
BUN: 11 mg/dL (ref 6–20)
CO2: 20 mmol/L — ABNORMAL LOW (ref 22–32)
Calcium: 6.7 mg/dL — ABNORMAL LOW (ref 8.9–10.3)
Chloride: 117 mmol/L — ABNORMAL HIGH (ref 98–111)
Creatinine, Ser: 0.66 mg/dL (ref 0.61–1.24)
GFR, Estimated: >60 mL/min (ref >60)
Glucose, Bld: 106 mg/dL — ABNORMAL HIGH (ref 70–99)
Potassium: 3.1 mmol/L — ABNORMAL LOW (ref 3.5–5.1)
Sodium: 143 mmol/L (ref 135–145)

## 2021-05-08 LAB — GLUCOSE, CAPILLARY
Glucose-Capillary: 112 mg/dL — ABNORMAL HIGH (ref 70–99)
Glucose-Capillary: 121 mg/dL — ABNORMAL HIGH (ref 70–99)
Glucose-Capillary: 129 mg/dL — ABNORMAL HIGH (ref 70–99)
Glucose-Capillary: 134 mg/dL — ABNORMAL HIGH (ref 70–99)
Glucose-Capillary: 137 mg/dL — ABNORMAL HIGH (ref 70–99)
Glucose-Capillary: 147 mg/dL — ABNORMAL HIGH (ref 70–99)

## 2021-05-08 MED ORDER — POTASSIUM CHLORIDE 20 MEQ PO PACK
40.0000 meq | PACK | Freq: Two times a day (BID) | ORAL | Status: AC
  Administered 2021-05-08 (×2): 40 meq
  Filled 2021-05-08 (×2): qty 2

## 2021-05-08 MED ORDER — LACTATED RINGERS IV SOLN: INTRAVENOUS | Status: DC

## 2021-05-08 MED ORDER — CALCIUM GLUCONATE-NACL 2-0.675 GM/100ML-% IV SOLN
2.0000 g | Freq: Once | INTRAVENOUS | Status: AC
  Administered 2021-05-08: 2000 mg via INTRAVENOUS
  Filled 2021-05-08: qty 100

## 2021-05-08 NOTE — Plan of Care (Signed)
  Problem: Safety: Goal: Non-violent Restraint(s) Outcome: Progressing   Problem: Education: Goal: Knowledge of General Education information will improve Description: Including pain rating scale, medication(s)/side effects and non-pharmacologic comfort measures Outcome: Progressing   Problem: Health Behavior/Discharge Planning: Goal: Ability to manage health-related needs will improve Outcome: Progressing   Problem: Clinical Measurements: Goal: Will remain free from infection Outcome: Progressing   Problem: Nutrition: Goal: Adequate nutrition will be maintained Outcome: Progressing   Problem: Pain Managment: Goal: General experience of comfort will improve Outcome: Progressing   Problem: Skin Integrity: Goal: Risk for impaired skin integrity will decrease Outcome: Progressing
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
## 2021-05-09 ENCOUNTER — Inpatient Hospital Stay (HOSPITAL_COMMUNITY): Payer: Medicaid Other

## 2021-05-09 LAB — GLUCOSE, CAPILLARY
Glucose-Capillary: 130 mg/dL — ABNORMAL HIGH (ref 70–99)
Glucose-Capillary: 138 mg/dL — ABNORMAL HIGH (ref 70–99)
Glucose-Capillary: 148 mg/dL — ABNORMAL HIGH (ref 70–99)
Glucose-Capillary: 121 mg/dL — ABNORMAL HIGH (ref 70–99)
Glucose-Capillary: 147 mg/dL — ABNORMAL HIGH (ref 70–99)
Glucose-Capillary: 117 mg/dL — ABNORMAL HIGH (ref 70–99)

## 2021-05-09 LAB — BASIC METABOLIC PANEL
Anion gap: 5 (ref 5–15)
BUN: 12 mg/dL (ref 6–20)
CO2: 22 mmol/L (ref 22–32)
Calcium: 7.5 mg/dL — ABNORMAL LOW (ref 8.9–10.3)
Chloride: 108 mmol/L (ref 98–111)
Creatinine, Ser: 0.57 mg/dL — ABNORMAL LOW (ref 0.61–1.24)
GFR, Estimated: >60 mL/min (ref >60)
Glucose, Bld: 117 mg/dL — ABNORMAL HIGH (ref 70–99)
Potassium: 4 mmol/L (ref 3.5–5.1)
Sodium: 135 mmol/L (ref 135–145)

## 2021-05-09 LAB — AEROBIC/ANAEROBIC CULTURE W GRAM STAIN (SURGICAL/DEEP WOUND)
Culture: NO GROWTH
Gram Stain: NONE SEEN

## 2021-05-09 LAB — TRIGLYCERIDES: Triglycerides: 397 mg/dL — ABNORMAL HIGH (ref <150)

## 2021-05-09 MED ORDER — DIVALPROEX SODIUM 125 MG PO DR TAB: 125.0000 mg | DELAYED_RELEASE_TABLET | Freq: Three times a day (TID) | ORAL | Status: DC

## 2021-05-09 MED ORDER — LORAZEPAM 1 MG PO TABS: 1.0000 mg | ORAL_TABLET | ORAL | Status: DC | PRN

## 2021-05-09 MED ORDER — LORAZEPAM 2 MG/ML IJ SOLN: 1.0000 mg | INTRAMUSCULAR | Status: DC | PRN

## 2021-05-09 MED ORDER — VANCOMYCIN HCL 1750 MG/350ML IV SOLN
1750.0000 mg | Freq: Two times a day (BID) | INTRAVENOUS | Status: DC
  Filled 2021-05-09: qty 350

## 2021-05-09 MED ORDER — VANCOMYCIN HCL 1750 MG/350ML IV SOLN
1750.0000 mg | Freq: Once | INTRAVENOUS | Status: DC
  Filled 2021-05-09: qty 350

## 2021-05-09 MED ORDER — VALPROIC ACID 250 MG/5ML PO SOLN
125.0000 mg | Freq: Three times a day (TID) | ORAL | Status: DC
  Administered 2021-05-09 – 2021-05-10 (×3): 125 mg
  Filled 2021-05-09 (×3): qty 5

## 2021-05-09 MED ORDER — VANCOMYCIN HCL 10 G IV SOLR
1750.0000 mg | Freq: Two times a day (BID) | INTRAVENOUS | Status: DC
  Administered 2021-05-09 – 2021-05-12 (×6): 1750 mg via INTRAVENOUS
  Filled 2021-05-09: qty 17.5
  Filled 2021-05-09: qty 35
  Filled 2021-05-09 (×7): qty 17.5

## 2021-05-09 MED ORDER — PANTOPRAZOLE 2 MG/ML SUSPENSION
40.0000 mg | Freq: Every day | ORAL | Status: DC
  Administered 2021-05-09 – 2021-05-21 (×13): 40 mg
  Filled 2021-05-09 (×13): qty 20

## 2021-05-09 NOTE — Progress Notes (Signed)
PT Cancellation Note  Patient Details Name: MCKADE GURKA MRN: [id number] DOB: [birth year]   Cancelled Treatment:    Reason Eval/Treat Not Completed: Medical issues which prohibited therapy;  checked with RN who reports pt with increased agitation and failed SBT this am with desats to 80's and coughing a lot.  Will hold for today and continue attempts.    Elray Mcgregor 05/09/2021, 10:03 AM Sheran Lawless, PT Acute Rehabilitation Services [phone] [phone] 05/09/2021

## 2021-05-09 NOTE — Progress Notes (Signed)
Pharmacy Antibiotic Note  Nathan West is a 31 y.o. male admitted on 04/02/2021 with pneumonia and initiated on cefepime. Now pleural peel culture growing MRSA. Pharmacy has been consulted for vancomycin dosing. SCr stable 0.57.  Plan: D/c cefepime Vancomycin 1750mg  IV x 1; then 1750mg  IV q12h. Goal AUC 400-550. Expected AUC: 475 SCr used: 0.8 Monitor clinical progress, c/s, renal function F/u de-escalation plan/LOT, vancomycin levels as indicated   Height: 5\' 9"  (175.3 cm) Weight: 89.4 kg (197 lb 1.5 oz) IBW/kg (Calculated) : 70.7  Temp (24hrs), Avg:100.8 F (38.2 C), Min:100.3 F (37.9 C), Max:101.2 F (38.4 C)  Recent Labs  Lab 2021/05/21 1808 05/06/21 0425 05/06/21 1530 05/07/21 0617 05/08/21 0610 05/09/21 0601  WBC 16.0* 21.8* 20.7* 14.9* 11.4*  --   CREATININE 0.57* 0.79  --  0.84 0.66 0.57*    Estimated Creatinine Clearance: 146.6 mL/min (A) (by C-G formula based on SCr of 0.57 mg/dL (L)).    No Known Allergies   07/07/21, PharmD, BCPS Please check AMION for all [facility] contact numbers Clinical Pharmacist 05/09/2021 1:29 PM
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
## 2021-05-09 NOTE — Progress Notes (Signed)
   Subjective/Interval: Intubated and sedated this AM. Pending trial of vent weaning.    Objective:   VITALS:   Vitals:   05/09/21 0500 05/09/21 0600 05/09/21 0700 05/09/21 0730  BP: 117/76 119/67 105/70 114/73  Pulse: 97 99 89 87  Resp: 18 17 17  (!) 21  Temp:      TempSrc:      SpO2: 96% 95% 97% 97%  Weight: 89.4 kg     Height:       Left arm is in a dressing which is clean dry and intact.  2+ radial pulse.  Fingers warm and well-perfused. Compartments are compressible dorsally and volarly.   Lab Results  Component Value Date   WBC 11.4 (H) 05/08/2021   HGB 8.0 (L) 05/08/2021   HCT 25.6 (L) 05/08/2021   MCV 93.4 05/08/2021   PLT 623 (H) 05/08/2021     Assessment/Plan: Status post left both bone forearm fracture fixation with fasciotomy closure, removal of Ex-Fix, radial nerve exploration 4/26  - Patient to continue to work with occupational therapy when he is able - DVT ppx - SCDs, ambulation, Lovenox for contralateral upper extremity DVT - NWB operative extremity, he may begin gentle range of motion about the elbow and wrist/hand as tolerated, no lifting with left arm when he is able -Plan for xrays, dressing change today this AM -Orthopedics will continue to follow  Margee Trentham 05/09/2021, 8:00 AM

## 2021-05-10 LAB — CBC
HCT: 25.5 % — ABNORMAL LOW (ref 39.0–52.0)
Hemoglobin: 7.9 g/dL — ABNORMAL LOW (ref 13.0–17.0)
MCH: 28.9 pg (ref 26.0–34.0)
MCHC: 31 g/dL (ref 30.0–36.0)
MCV: 93.4 fL (ref 80.0–100.0)
Platelets: 675 10*3/uL — ABNORMAL HIGH (ref 150–400)
RBC: 2.73 MIL/uL — ABNORMAL LOW (ref 4.22–5.81)
RDW: 16.1 % — ABNORMAL HIGH (ref 11.5–15.5)
WBC: 13.6 10*3/uL — ABNORMAL HIGH (ref 4.0–10.5)
nRBC: 0.3 % — ABNORMAL HIGH (ref 0.0–0.2)

## 2021-05-10 LAB — AEROBIC/ANAEROBIC CULTURE W GRAM STAIN (SURGICAL/DEEP WOUND)

## 2021-05-10 LAB — BASIC METABOLIC PANEL
Anion gap: 8 (ref 5–15)
BUN: 13 mg/dL (ref 6–20)
CO2: 27 mmol/L (ref 22–32)
Calcium: 7.8 mg/dL — ABNORMAL LOW (ref 8.9–10.3)
Chloride: 105 mmol/L (ref 98–111)
Creatinine, Ser: 0.65 mg/dL (ref 0.61–1.24)
GFR, Estimated: >60 mL/min (ref >60)
Glucose, Bld: 129 mg/dL — ABNORMAL HIGH (ref 70–99)
Potassium: 3.8 mmol/L (ref 3.5–5.1)
Sodium: 140 mmol/L (ref 135–145)

## 2021-05-10 LAB — GLUCOSE, CAPILLARY
Glucose-Capillary: 134 mg/dL — ABNORMAL HIGH (ref 70–99)
Glucose-Capillary: 129 mg/dL — ABNORMAL HIGH (ref 70–99)
Glucose-Capillary: 139 mg/dL — ABNORMAL HIGH (ref 70–99)
Glucose-Capillary: 147 mg/dL — ABNORMAL HIGH (ref 70–99)
Glucose-Capillary: 102 mg/dL — ABNORMAL HIGH (ref 70–99)
Glucose-Capillary: 150 mg/dL — ABNORMAL HIGH (ref 70–99)

## 2021-05-10 LAB — MAGNESIUM: Magnesium: 1.9 mg/dL (ref 1.7–2.4)

## 2021-05-10 LAB — PHOSPHORUS: Phosphorus: 2 mg/dL — ABNORMAL LOW (ref 2.5–4.6)

## 2021-05-10 MED ORDER — PHENOBARBITAL 32.4 MG PO TABS
32.4000 mg | ORAL_TABLET | Freq: Three times a day (TID) | ORAL | Status: AC
  Administered 2021-05-14 – 2021-05-16 (×6): 32.4 mg
  Filled 2021-05-10 (×6): qty 1

## 2021-05-10 MED ORDER — PHENOBARBITAL 32.4 MG PO TABS
64.8000 mg | ORAL_TABLET | Freq: Three times a day (TID) | ORAL | Status: AC
  Administered 2021-05-12 – 2021-05-14 (×6): 64.8 mg
  Filled 2021-05-10 (×6): qty 2

## 2021-05-10 MED ORDER — PHENOBARBITAL 97.2 MG PO TABS
97.2000 mg | ORAL_TABLET | Freq: Three times a day (TID) | ORAL | Status: AC
  Administered 2021-05-10 – 2021-05-12 (×6): 97.2 mg
  Filled 2021-05-10 (×6): qty 1

## 2021-05-10 MED ORDER — LORAZEPAM 2 MG/ML IJ SOLN
1.0000 mg | INTRAMUSCULAR | Status: DC | PRN
  Administered 2021-05-10 – 2021-05-12 (×2): 1 mg via INTRAVENOUS
  Filled 2021-05-10 (×2): qty 1

## 2021-05-10 MED ORDER — POTASSIUM PHOSPHATES 15 MMOLE/5ML IV SOLN
15.0000 mmol | Freq: Once | INTRAVENOUS | Status: AC
  Administered 2021-05-10: 15 mmol via INTRAVENOUS
  Filled 2021-05-10: qty 5

## 2021-05-10 NOTE — Progress Notes (Signed)
PT Cancellation Note  Patient Details Name: Nathan West MRN: [id number] DOB: Apr 12, 1990   Cancelled Treatment:    Reason Eval/Treat Not Completed: Medical issues which prohibited therapy; pt continues with poor tolerance to weaning.  Will sign off for now and anticipate new orders when pt medically ready.    Elray Mcgregor 05/10/2021, 11:58 AM Sheran Lawless, PT Acute Rehabilitation Services Pager:[phone] Office:[phone] 05/10/2021
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
## 2021-05-10 NOTE — Progress Notes (Signed)
OT Cancellation Note  Patient Details Name: Nathan West MRN: [id number] DOB: [birth year]   Cancelled Treatment:    Reason Eval/Treat Not Completed: Medical issues which prohibited therapy. Pt remains intubated/sedated. Will sign off at this time and resume when pt able to tolerate. Thanks Alvira Hecht,HILLARY 05/10/2021, 12:03 PM Luisa Dago, OT/L   Acute OT Clinical Specialist Acute Rehabilitation Services Pager [phone] Office [phone]

## 2021-05-11 LAB — CBC
HCT: 22.6 % — ABNORMAL LOW (ref 39.0–52.0)
Hemoglobin: 7.1 g/dL — ABNORMAL LOW (ref 13.0–17.0)
MCH: 29.3 pg (ref 26.0–34.0)
MCHC: 31.4 g/dL (ref 30.0–36.0)
MCV: 93.4 fL (ref 80.0–100.0)
Platelets: 610 10*3/uL — ABNORMAL HIGH (ref 150–400)
RBC: 2.42 MIL/uL — ABNORMAL LOW (ref 4.22–5.81)
RDW: 15.9 % — ABNORMAL HIGH (ref 11.5–15.5)
WBC: 11.5 10*3/uL — ABNORMAL HIGH (ref 4.0–10.5)
nRBC: 0.4 % — ABNORMAL HIGH (ref 0.0–0.2)

## 2021-05-11 LAB — GLUCOSE, CAPILLARY
Glucose-Capillary: 132 mg/dL — ABNORMAL HIGH (ref 70–99)
Glucose-Capillary: 135 mg/dL — ABNORMAL HIGH (ref 70–99)
Glucose-Capillary: 137 mg/dL — ABNORMAL HIGH (ref 70–99)
Glucose-Capillary: 121 mg/dL — ABNORMAL HIGH (ref 70–99)
Glucose-Capillary: 128 mg/dL — ABNORMAL HIGH (ref 70–99)

## 2021-05-11 LAB — BASIC METABOLIC PANEL
Anion gap: 5 (ref 5–15)
BUN: 11 mg/dL (ref 6–20)
CO2: 27 mmol/L (ref 22–32)
Calcium: 6.9 mg/dL — ABNORMAL LOW (ref 8.9–10.3)
Chloride: 109 mmol/L (ref 98–111)
Creatinine, Ser: 0.51 mg/dL — ABNORMAL LOW (ref 0.61–1.24)
GFR, Estimated: >60 mL/min (ref >60)
Glucose, Bld: 119 mg/dL — ABNORMAL HIGH (ref 70–99)
Potassium: 3.4 mmol/L — ABNORMAL LOW (ref 3.5–5.1)
Sodium: 141 mmol/L (ref 135–145)

## 2021-05-11 LAB — VANCOMYCIN, PEAK: Vancomycin Pk: 28 ug/mL — ABNORMAL LOW (ref 30–40)

## 2021-05-11 MED ORDER — PHENOBARBITAL SODIUM 130 MG/ML IJ SOLN
130.0000 mg | Freq: Once | INTRAMUSCULAR | Status: AC
  Administered 2021-05-11: 130 mg via INTRAVENOUS
  Filled 2021-05-11: qty 1

## 2021-05-11 MED ORDER — POTASSIUM CHLORIDE 20 MEQ PO PACK
40.0000 meq | PACK | ORAL | Status: AC
  Administered 2021-05-11 (×2): 40 meq
  Filled 2021-05-11 (×2): qty 2
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
## 2021-05-12 ENCOUNTER — Inpatient Hospital Stay (HOSPITAL_COMMUNITY): Payer: Medicaid Other

## 2021-05-12 LAB — COMPREHENSIVE METABOLIC PANEL
ALT: 71 U/L — ABNORMAL HIGH (ref 0–44)
AST: 59 U/L — ABNORMAL HIGH (ref 15–41)
Albumin: <1.5 g/dL — ABNORMAL LOW (ref 3.5–5.0)
Alkaline Phosphatase: 223 U/L — ABNORMAL HIGH (ref 38–126)
Anion gap: 6 (ref 5–15)
BUN: 14 mg/dL (ref 6–20)
CO2: 27 mmol/L (ref 22–32)
Calcium: 7.4 mg/dL — ABNORMAL LOW (ref 8.9–10.3)
Chloride: 105 mmol/L (ref 98–111)
Creatinine, Ser: 0.61 mg/dL (ref 0.61–1.24)
GFR, Estimated: >60 mL/min (ref >60)
Glucose, Bld: 124 mg/dL — ABNORMAL HIGH (ref 70–99)
Potassium: 3.8 mmol/L (ref 3.5–5.1)
Sodium: 138 mmol/L (ref 135–145)
Total Bilirubin: 0.5 mg/dL (ref 0.3–1.2)
Total Protein: 6 g/dL — ABNORMAL LOW (ref 6.5–8.1)

## 2021-05-12 LAB — CBC
HCT: 23.8 % — ABNORMAL LOW (ref 39.0–52.0)
Hemoglobin: 7.6 g/dL — ABNORMAL LOW (ref 13.0–17.0)
MCH: 29.5 pg (ref 26.0–34.0)
MCHC: 31.9 g/dL (ref 30.0–36.0)
MCV: 92.2 fL (ref 80.0–100.0)
Platelets: 664 10*3/uL — ABNORMAL HIGH (ref 150–400)
RBC: 2.58 MIL/uL — ABNORMAL LOW (ref 4.22–5.81)
RDW: 15.9 % — ABNORMAL HIGH (ref 11.5–15.5)
WBC: 12.1 10*3/uL — ABNORMAL HIGH (ref 4.0–10.5)
nRBC: 1.1 % — ABNORMAL HIGH (ref 0.0–0.2)

## 2021-05-12 LAB — GLUCOSE, CAPILLARY
Glucose-Capillary: 126 mg/dL — ABNORMAL HIGH (ref 70–99)
Glucose-Capillary: 127 mg/dL — ABNORMAL HIGH (ref 70–99)
Glucose-Capillary: 133 mg/dL — ABNORMAL HIGH (ref 70–99)
Glucose-Capillary: 134 mg/dL — ABNORMAL HIGH (ref 70–99)
Glucose-Capillary: 134 mg/dL — ABNORMAL HIGH (ref 70–99)
Glucose-Capillary: 141 mg/dL — ABNORMAL HIGH (ref 70–99)
Glucose-Capillary: 143 mg/dL — ABNORMAL HIGH (ref 70–99)

## 2021-05-12 LAB — VANCOMYCIN, TROUGH: Vancomycin Tr: 10 ug/mL — ABNORMAL LOW (ref 15–20)

## 2021-05-12 LAB — TRIGLYCERIDES: Triglycerides: 189 mg/dL — ABNORMAL HIGH (ref <150)

## 2021-05-12 LAB — PHOSPHORUS: Phosphorus: 2.6 mg/dL (ref 2.5–4.6)

## 2021-05-12 MED ORDER — BETHANECHOL CHLORIDE 25 MG PO TABS
25.0000 mg | ORAL_TABLET | Freq: Three times a day (TID) | ORAL | Status: DC
  Administered 2021-05-12 – 2021-05-19 (×22): 25 mg
  Filled 2021-05-12 (×22): qty 1

## 2021-05-12 MED ORDER — VANCOMYCIN HCL 1500 MG/300ML IV SOLN
1500.0000 mg | Freq: Two times a day (BID) | INTRAVENOUS | Status: DC
  Administered 2021-05-12 – 2021-05-15 (×5): 1500 mg via INTRAVENOUS
  Filled 2021-05-12 (×6): qty 300

## 2021-05-12 NOTE — Progress Notes (Signed)
Pt on SBT with desaturation to 75%. Pt diaphoretic, tachypneic and HR to the 130's. Pt placed on full support at this time and given 100% FiO2. SpO2 increased to 94%. FiO2 weaned back down to 40%. Dr. Janee Morn at pt bedside. RT will continue to monitor and be available as needed.

## 2021-05-12 NOTE — Progress Notes (Signed)
Pharmacy Antibiotic Note  Nathan West is a 31 y.o. male admitted on 04/05/2021 with pneumonia and initiated on cefepime. Now pleural peel culture growing MRSA. Pharmacy has been consulted for vancomycin dosing. SCr stable 0.61.  Vancomycin peak 28, trough 10 on current dose for calculated AUC of 536 (therapeutic).  Plan: Adjust vancomycin slightly to 1500mg  IV q12h with calculated AUC near top of desired range. Goal AUC 400-550. New estimated AUC: 457 Monitor clinical progress, c/s, renal function Plan 7 days of therapy per Trauma (stop date 5/15) - will not plan to repeat vancomycin levels unless change in renal function   Height: 5\' 9"  (175.3 cm) Weight: 88.9 kg (195 lb 15.8 oz) IBW/kg (Calculated) : 70.7  Temp (24hrs), Avg:99.1 F (37.3 C), Min:98.2 F (36.8 C), Max:101 F (38.3 C)  Recent Labs  Lab 05/07/21 0617 05/08/21 0610 05/09/21 0601 05/10/21 0649 05/11/21 0534 05/11/21 2136 05/12/21 0521  WBC 14.9* 11.4*  --  13.6* 11.5*  --  12.1*  CREATININE 0.84 0.66 0.57* 0.65 0.51*  --  0.61  VANCOTROUGH  --   --   --   --   --   --  10*  VANCOPEAK  --   --   --   --   --  28*  --      Estimated Creatinine Clearance: 146.3 mL/min (by C-G formula based on SCr of 0.61 mg/dL).    No Known Allergies   [id number], PharmD, BCPS Please check AMION for all [facility] contact numbers Clinical Pharmacist 05/12/2021 11:22 AM
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
## 2021-05-13 ENCOUNTER — Inpatient Hospital Stay (HOSPITAL_COMMUNITY): Payer: Medicaid Other

## 2021-05-13 LAB — CBC
HCT: 24.1 % — ABNORMAL LOW (ref 39.0–52.0)
Hemoglobin: 8.1 g/dL — ABNORMAL LOW (ref 13.0–17.0)
MCH: 30.8 pg (ref 26.0–34.0)
MCHC: 33.6 g/dL (ref 30.0–36.0)
MCV: 91.6 fL (ref 80.0–100.0)
Platelets: 630 10*3/uL — ABNORMAL HIGH (ref 150–400)
RBC: 2.63 MIL/uL — ABNORMAL LOW (ref 4.22–5.81)
RDW: 15.9 % — ABNORMAL HIGH (ref 11.5–15.5)
WBC: 17.8 10*3/uL — ABNORMAL HIGH (ref 4.0–10.5)
nRBC: 1.6 % — ABNORMAL HIGH (ref 0.0–0.2)

## 2021-05-13 LAB — BASIC METABOLIC PANEL
Anion gap: 9 (ref 5–15)
BUN: 14 mg/dL (ref 6–20)
CO2: 29 mmol/L (ref 22–32)
Calcium: 7.6 mg/dL — ABNORMAL LOW (ref 8.9–10.3)
Chloride: 95 mmol/L — ABNORMAL LOW (ref 98–111)
Creatinine, Ser: 0.57 mg/dL — ABNORMAL LOW (ref 0.61–1.24)
GFR, Estimated: >60 mL/min (ref >60)
Glucose, Bld: 125 mg/dL — ABNORMAL HIGH (ref 70–99)
Potassium: 4.2 mmol/L (ref 3.5–5.1)
Sodium: 133 mmol/L — ABNORMAL LOW (ref 135–145)

## 2021-05-13 LAB — GLUCOSE, CAPILLARY
Glucose-Capillary: 114 mg/dL — ABNORMAL HIGH (ref 70–99)
Glucose-Capillary: 120 mg/dL — ABNORMAL HIGH (ref 70–99)
Glucose-Capillary: 148 mg/dL — ABNORMAL HIGH (ref 70–99)
Glucose-Capillary: 153 mg/dL — ABNORMAL HIGH (ref 70–99)
Glucose-Capillary: 171 mg/dL — ABNORMAL HIGH (ref 70–99)
Glucose-Capillary: 199 mg/dL — ABNORMAL HIGH (ref 70–99)

## 2021-05-13 MED ORDER — MIDAZOLAM HCL 2 MG/2ML IJ SOLN
2.0000 mg | INTRAMUSCULAR | Status: DC | PRN
  Administered 2021-05-13 – 2021-05-15 (×3): 2 mg via INTRAVENOUS
  Filled 2021-05-13 (×2): qty 2

## 2021-05-13 MED ORDER — MIDAZOLAM HCL 2 MG/2ML IJ SOLN
INTRAMUSCULAR | Status: AC
  Filled 2021-05-13: qty 2

## 2021-05-13 MED ORDER — PROPOFOL 1000 MG/100ML IV EMUL
5.0000 ug/kg/min | INTRAVENOUS | Status: DC
  Administered 2021-05-13 (×3): 80 ug/kg/min via INTRAVENOUS
  Administered 2021-05-13: 40 ug/kg/min via INTRAVENOUS
  Administered 2021-05-13 – 2021-05-14 (×6): 80 ug/kg/min via INTRAVENOUS
  Filled 2021-05-13: qty 200
  Filled 2021-05-13 (×2): qty 100
  Filled 2021-05-13: qty 200
  Filled 2021-05-13 (×4): qty 100
  Filled 2021-05-13: qty 200

## 2021-05-13 MED ORDER — METOPROLOL TARTRATE 5 MG/5ML IV SOLN
INTRAVENOUS | Status: AC
  Administered 2021-05-13: 5 mg via INTRAVENOUS
  Filled 2021-05-13: qty 5

## 2021-05-13 MED ORDER — NICARDIPINE HCL IN NACL 20-0.86 MG/200ML-% IV SOLN
3.0000 mg/h | INTRAVENOUS | Status: DC
  Administered 2021-05-13 (×4): 15 mg/h via INTRAVENOUS
  Administered 2021-05-13: 5 mg/h via INTRAVENOUS
  Administered 2021-05-13 (×3): 15 mg/h via INTRAVENOUS
  Administered 2021-05-14 (×3): 10 mg/h via INTRAVENOUS
  Administered 2021-05-14: 9 mg/h via INTRAVENOUS
  Administered 2021-05-14: 6 mg/h via INTRAVENOUS
  Administered 2021-05-14: 15 mg/h via INTRAVENOUS
  Administered 2021-05-14: 10 mg/h via INTRAVENOUS
  Administered 2021-05-14: 15 mg/h via INTRAVENOUS
  Administered 2021-05-14: 7 mg/h via INTRAVENOUS
  Administered 2021-05-14: 9 mg/h via INTRAVENOUS
  Administered 2021-05-14: 8 mg/h via INTRAVENOUS
  Administered 2021-05-15 (×2): 10 mg/h via INTRAVENOUS
  Filled 2021-05-13 (×2): qty 400
  Filled 2021-05-13: qty 600
  Filled 2021-05-13 (×2): qty 200
  Filled 2021-05-13: qty 400
  Filled 2021-05-13 (×2): qty 200
  Filled 2021-05-13: qty 400
  Filled 2021-05-13: qty 200
  Filled 2021-05-13: qty 600
  Filled 2021-05-13 (×3): qty 200

## 2021-05-13 MED ORDER — FUROSEMIDE 10 MG/ML IJ SOLN
40.0000 mg | Freq: Once | INTRAMUSCULAR | Status: AC
  Administered 2021-05-13: 40 mg via INTRAVENOUS
  Filled 2021-05-13: qty 4

## 2021-05-13 MED ORDER — METOPROLOL TARTRATE 5 MG/5ML IV SOLN: 5.0000 mg | Freq: Once | INTRAVENOUS | Status: AC

## 2021-05-13 MED ORDER — PROPOFOL 1000 MG/100ML IV EMUL
INTRAVENOUS | Status: AC
  Administered 2021-05-13: 20 ug/kg/min via INTRAVENOUS
  Filled 2021-05-13: qty 100

## 2021-05-13 NOTE — Progress Notes (Signed)
CPT held due to tachycardia. RN aware. RT will cont to monitor as needed.
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
## 2021-05-14 ENCOUNTER — Inpatient Hospital Stay (HOSPITAL_COMMUNITY): Payer: Medicaid Other

## 2021-05-14 DIAGNOSIS — J9601 Acute respiratory failure with hypoxia: Secondary | ICD-10-CM

## 2021-05-14 DIAGNOSIS — R571 Hypovolemic shock: Secondary | ICD-10-CM

## 2021-05-14 DIAGNOSIS — J9602 Acute respiratory failure with hypercapnia: Secondary | ICD-10-CM

## 2021-05-14 DIAGNOSIS — U071 COVID-19: Secondary | ICD-10-CM

## 2021-05-14 DIAGNOSIS — J80 Acute respiratory distress syndrome: Secondary | ICD-10-CM

## 2021-05-14 LAB — CBC
HCT: 20.4 % — ABNORMAL LOW (ref 39.0–52.0)
Hemoglobin: 6.5 g/dL — CL (ref 13.0–17.0)
MCH: 31.3 pg (ref 26.0–34.0)
MCHC: 31.9 g/dL (ref 30.0–36.0)
MCV: 98.1 fL (ref 80.0–100.0)
Platelets: 467 10*3/uL — ABNORMAL HIGH (ref 150–400)
RBC: 2.08 MIL/uL — ABNORMAL LOW (ref 4.22–5.81)
RDW: 15.9 % — ABNORMAL HIGH (ref 11.5–15.5)
WBC: 19.1 10*3/uL — ABNORMAL HIGH (ref 4.0–10.5)
nRBC: 0.8 % — ABNORMAL HIGH (ref 0.0–0.2)

## 2021-05-14 LAB — GLUCOSE, CAPILLARY
Glucose-Capillary: 119 mg/dL — ABNORMAL HIGH (ref 70–99)
Glucose-Capillary: 120 mg/dL — ABNORMAL HIGH (ref 70–99)
Glucose-Capillary: 127 mg/dL — ABNORMAL HIGH (ref 70–99)
Glucose-Capillary: 161 mg/dL — ABNORMAL HIGH (ref 70–99)
Glucose-Capillary: 171 mg/dL — ABNORMAL HIGH (ref 70–99)
Glucose-Capillary: 177 mg/dL — ABNORMAL HIGH (ref 70–99)

## 2021-05-14 LAB — BASIC METABOLIC PANEL
Anion gap: 8 (ref 5–15)
BUN: 21 mg/dL — ABNORMAL HIGH (ref 6–20)
CO2: 26 mmol/L (ref 22–32)
Calcium: 6.5 mg/dL — ABNORMAL LOW (ref 8.9–10.3)
Chloride: 96 mmol/L — ABNORMAL LOW (ref 98–111)
Creatinine, Ser: 0.83 mg/dL (ref 0.61–1.24)
GFR, Estimated: >60 mL/min (ref >60)
Glucose, Bld: 117 mg/dL — ABNORMAL HIGH (ref 70–99)
Potassium: 4.6 mmol/L (ref 3.5–5.1)
Sodium: 130 mmol/L — ABNORMAL LOW (ref 135–145)

## 2021-05-14 LAB — POCT I-STAT 7, (LYTES, BLD GAS, ICA,H+H)
Acid-Base Excess: 4 mmol/L — ABNORMAL HIGH (ref 0.0–2.0)
Acid-Base Excess: 6 mmol/L — ABNORMAL HIGH (ref 0.0–2.0)
Bicarbonate: 32.8 mmol/L — ABNORMAL HIGH (ref 20.0–28.0)
Bicarbonate: 32.6 mmol/L — ABNORMAL HIGH (ref 20.0–28.0)
Calcium, Ion: 1.13 mmol/L — ABNORMAL LOW (ref 1.15–1.40)
Calcium, Ion: 1.11 mmol/L — ABNORMAL LOW (ref 1.15–1.40)
HCT: 26 % — ABNORMAL LOW (ref 39.0–52.0)
HCT: 28 % — ABNORMAL LOW (ref 39.0–52.0)
Hemoglobin: 8.8 g/dL — ABNORMAL LOW (ref 13.0–17.0)
Hemoglobin: 9.5 g/dL — ABNORMAL LOW (ref 13.0–17.0)
O2 Saturation: 79 %
O2 Saturation: 99 %
Patient temperature: 99.9
Potassium: 5.3 mmol/L — ABNORMAL HIGH (ref 3.5–5.1)
Potassium: 5.1 mmol/L (ref 3.5–5.1)
Sodium: 135 mmol/L (ref 135–145)
Sodium: 135 mmol/L (ref 135–145)
TCO2: 35 mmol/L — ABNORMAL HIGH (ref 22–32)
TCO2: 34 mmol/L — ABNORMAL HIGH (ref 22–32)
pCO2 arterial: 80 mmHg (ref 32–48)
pCO2 arterial: 55.6 mmHg — ABNORMAL HIGH (ref 32–48)
pH, Arterial: 7.225 — ABNORMAL LOW (ref 7.35–7.45)
pH, Arterial: 7.376 (ref 7.35–7.45)
pO2, Arterial: 56 mmHg — ABNORMAL LOW (ref 83–108)
pO2, Arterial: 144 mmHg — ABNORMAL HIGH (ref 83–108)

## 2021-05-14 LAB — HEMOGLOBIN AND HEMATOCRIT, BLOOD
HCT: 20.3 % — ABNORMAL LOW (ref 39.0–52.0)
HCT: 24.2 % — ABNORMAL LOW (ref 39.0–52.0)
Hemoglobin: 6.4 g/dL — CL (ref 13.0–17.0)
Hemoglobin: 8.3 g/dL — ABNORMAL LOW (ref 13.0–17.0)

## 2021-05-14 LAB — PREPARE RBC (CROSSMATCH)

## 2021-05-14 LAB — TRIGLYCERIDES
Triglycerides: 1278 mg/dL — ABNORMAL HIGH (ref <150)
Triglycerides: 356 mg/dL — ABNORMAL HIGH (ref <150)

## 2021-05-14 MED ORDER — VECURONIUM BOLUS VIA INFUSION
5.0000 mg | Freq: Once | INTRAVENOUS | Status: AC
  Administered 2021-05-14: 5 mg via INTRAVENOUS
  Filled 2021-05-14: qty 5

## 2021-05-14 MED ORDER — PROPOFOL 1000 MG/100ML IV EMUL
25.0000 ug/kg/min | INTRAVENOUS | Status: DC
  Administered 2021-05-14: 65 ug/kg/min via INTRAVENOUS
  Administered 2021-05-14: 60 ug/kg/min via INTRAVENOUS
  Administered 2021-05-14: 80 ug/kg/min via INTRAVENOUS
  Administered 2021-05-14 (×2): 65 ug/kg/min via INTRAVENOUS
  Administered 2021-05-15 (×3): 80 ug/kg/min via INTRAVENOUS
  Administered 2021-05-15: 65 ug/kg/min via INTRAVENOUS
  Administered 2021-05-15: 60 ug/kg/min via INTRAVENOUS
  Administered 2021-05-15: 80 ug/kg/min via INTRAVENOUS
  Administered 2021-05-15: 65 ug/kg/min via INTRAVENOUS
  Administered 2021-05-15 – 2021-05-16 (×5): 80 ug/kg/min via INTRAVENOUS
  Filled 2021-05-14: qty 200
  Filled 2021-05-14: qty 100
  Filled 2021-05-14 (×2): qty 200
  Filled 2021-05-14: qty 100
  Filled 2021-05-14 (×4): qty 200
  Filled 2021-05-14 (×2): qty 100

## 2021-05-14 MED ORDER — SODIUM CHLORIDE 0.9% IV SOLUTION: Freq: Once | INTRAVENOUS | Status: AC

## 2021-05-14 MED ORDER — FENTANYL CITRATE PF 50 MCG/ML IJ SOSY: 50.0000 ug | PREFILLED_SYRINGE | Freq: Once | INTRAMUSCULAR | Status: AC

## 2021-05-14 MED ORDER — VECURONIUM BROMIDE 10 MG IV SOLR
0.0000 ug/kg/min | Status: DC
  Administered 2021-05-14: 1.4 ug/kg/min via INTRAVENOUS
  Administered 2021-05-14: 1 ug/kg/min via INTRAVENOUS
  Administered 2021-05-15 (×2): 1.4 ug/kg/min via INTRAVENOUS
  Filled 2021-05-14 (×5): qty 100

## 2021-05-14 MED ORDER — ARTIFICIAL TEARS OPHTHALMIC OINT
1.0000 "application " | TOPICAL_OINTMENT | Freq: Three times a day (TID) | OPHTHALMIC | Status: DC
  Administered 2021-05-14 – 2021-05-27 (×39): 1 via OPHTHALMIC
  Filled 2021-05-14 (×3): qty 3.5

## 2021-05-14 MED ORDER — PIPERACILLIN-TAZOBACTAM 3.375 G IVPB
3.3750 g | Freq: Three times a day (TID) | INTRAVENOUS | Status: DC
  Administered 2021-05-14 – 2021-05-16 (×6): 3.375 g via INTRAVENOUS
  Filled 2021-05-14 (×6): qty 50

## 2021-05-14 NOTE — Progress Notes (Signed)
Critical ABG results given to Dr. Merrily Pew. Verbal order received to increase RR to 26, peep to 10 and plans to bronch pt then prone. RT will continue to monitor and be available as needed.

## 2021-05-14 NOTE — Progress Notes (Addendum)
      [address]       [place] [address]             [phone]       9 Days Post-Op Procedure(s) (LRB): VIDEO ASSISTED THORACOSCOPY (Right)  Subjective: He is sedated, on vent  Objective: Vital signs in last 24 hours: Temp:  [100.2 F (37.9 C)-101.8 F (38.8 C)] 100.9 F (38.3 C) (05/13 1100) Pulse Rate:  [122-143] 134 (05/13 1100) Cardiac Rhythm: Sinus tachycardia (05/13 0800) Resp:  [18-26] 22 (05/13 1100) BP: (130-174)/(48-125) 146/56 (05/13 1100) SpO2:  [88 %-100 %] 88 % (05/13 1100) FiO2 (%):  [70 %-100 %] 100 % (05/13 0354) Weight:  [91.6 kg] 91.6 kg (05/13 0500)     Intake/Output from previous day: 05/12 0701 - 05/13 0700 In: 6253.8 [I.V.:4108.4; NG/GT:1750; IV Piggyback:395.4] Out: 3415 [Urine:3415]   Physical Exam:  Cardiovascular: Tachycardic Pulmonary: Coarse breath sounds Abdomen: Soft, rare bowel sounds Extremities: SCDs in place Wounds: Dressing removed. Small skin tear. Clean and dry. Chest Tube: to suction, no air leak  Lab Results: CBC: Recent Labs    05/13/21 0527 05/14/21 0600 05/14/21 0742  WBC 17.8* 19.1*  --   HGB 8.1* 6.5* 6.4*  HCT 24.1* 20.4* 20.3*  PLT 630* 467*  --    BMET:  Recent Labs    05/13/21 0527 05/14/21 0600  NA 133* 130*  K 4.2 4.6  CL 95* 96*  CO2 29 26  GLUCOSE 125* 117*  BUN 14 21*  CREATININE 0.57* 0.83  CALCIUM 7.6* 6.5*    PT/INR: No results for input(s): LABPROT, INR in the last 72 hours. ABG:  INR: Will add last result for INR, ABG once components are confirmed Will add last 4 CBG results once components are confirmed  Assessment/Plan:  1. CV - ST 2.  Pulmonary - VDRF;on vent. Has failed weaning attempts. Chest tube with scant output. Right chest tube is to suction, no air leak. CXR this am shows moderate loculated right pleural effusion, small right apical pneumothorax. Will place chest tube to water seal. Check CXR in am. ? If should obtain a repeat CT chest for  further evaluation. Dr. Donata Clay to evaluate 3. Fever to 100.9 and leukocytosis-WBC increased to 19,100 this am. On Vancomycin 4. Anemia-H and H this am decreased to 6.4 and 20.3. Transfusion ordered. Per primary  Donielle M ZimmermanPA-C 05/14/2021,11:45 AM [phone]   Over the past several days the aeration of the right lung has gradually improved. With scant drainage and no air leak from chest tube, if a.m. chest x-ray is stable we will remove chest tube tomorrow.  patient examined and medical record reviewed,agree with above note. Lovett Sox 05/14/2021
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
## 2021-05-14 NOTE — Progress Notes (Signed)
RT assisted Dr. Merrily Pew with bedside bronchoscopy. Sample obtained from R middle lobe and sent to lab. Continued plans for proning pt. RT will continue to monitor and be available as needed.
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
## 2021-05-14 NOTE — Procedures (Signed)
Bronchoscopy Procedure Note  DELYNN PURSLEY  [id number]  Jan 16, 1990  Date:05/14/21  Time:2:49 PM   Provider Performing:Omare Bilotta   Procedure(s):  Flexible bronchoscopy with bronchial alveolar lavage (11735) and Initial Therapeutic Aspiration of Tracheobronchial Tree (67014)  Indication(s) Severe ARDS  Consent Risks of the procedure as well as the alternatives and risks of each were explained to the patient and/or caregiver.  Consent for the procedure was obtained and is signed in the bedside chart  Anesthesia Propofol and fentanyl   Time Out Verified patient identification, verified procedure, site/side was marked, verified correct patient position, special equipment/implants available, medications/allergies/relevant history reviewed, required imaging and test results available.   Sterile Technique Usual hand hygiene, masks, gowns, and gloves were used   Procedure Description Bronchoscope advanced through endotracheal tube and into airway.  Airways were examined down to subsegmental level with findings noted below.   Following diagnostic evaluation, BAL(s) performed in right middle lobe with normal saline and return of 5 cc yellowish fluid and Therapeutic aspiration performed in right upper lobe, right middle lobe, right lower lobe and left lower  Findings: Copious amount of thick yellowish secretions noted throughout the right upper, middle and lower lobes but also noted on left lower lobe   Complications/Tolerance None; patient tolerated the procedure well. Chest X-ray is needed post procedure.   EBL Minimal   Specimen(s) Sputum

## 2021-05-14 NOTE — Progress Notes (Signed)
Prone ABG results given to Dr. Merrily Pew. Verbal order received to wean FiO2 to 80% and obtain an abg at 0500 05/15/21. RT x2 and RN x1 turned pts head. Suction catheter able to passed with ease. RT will continue to monitor and be available.

## 2021-05-14 NOTE — Progress Notes (Signed)
Pts ETT holister removed and mepilex pads applied to pts cheeks and upper lip. Small abrasion noted to the right upper cheek. Pts ETT resecured with Cloth tape at 27cm. Pt then proned, RT x2, RN x4 and NT x1 at bedside. No complications during proning, suction catheter able to be passed. RT will continue to monitor and obtain abg in 1hr per CCM MD request/order.

## 2021-05-14 NOTE — Progress Notes (Addendum)
Date and time results received: 05/14/21 0730 (use smartphrase ".now" to insert current time)  Test: Hemoglobin  Critical Value: 6.5  Name of Provider Notified: Kae Heller, MD   Orders Received? Or Actions Taken?:  Ordered to redraw hgb  After redraw MD ordered to transfuse 2 units of RBCs

## 2021-05-15 ENCOUNTER — Inpatient Hospital Stay (HOSPITAL_COMMUNITY): Payer: Medicaid Other

## 2021-05-15 DIAGNOSIS — I509 Heart failure, unspecified: Secondary | ICD-10-CM

## 2021-05-15 LAB — ECHOCARDIOGRAM COMPLETE
AR max vel: 2.02 cm2
AV Area VTI: 1.91 cm2
AV Area mean vel: 1.89 cm2
AV Mean grad: 10 mmHg
AV Peak grad: 18.7 mmHg
Ao pk vel: 2.16 m/s
Height: 69 in
S' Lateral: 3.75 cm
Weight: 3231.06 oz

## 2021-05-15 LAB — BPAM RBC
Blood Product Expiration Date: 202306072359
Blood Product Expiration Date: 202306072359
ISSUE DATE / TIME: 202305131027
ISSUE DATE / TIME: 202305131027
Unit Type and Rh: 5100
Unit Type and Rh: 5100

## 2021-05-15 LAB — POCT I-STAT 7, (LYTES, BLD GAS, ICA,H+H)
Acid-Base Excess: 4 mmol/L — ABNORMAL HIGH (ref 0.0–2.0)
Acid-Base Excess: 2 mmol/L (ref 0.0–2.0)
Bicarbonate: 31 mmol/L — ABNORMAL HIGH (ref 20.0–28.0)
Bicarbonate: 28.7 mmol/L — ABNORMAL HIGH (ref 20.0–28.0)
Calcium, Ion: 1.12 mmol/L — ABNORMAL LOW (ref 1.15–1.40)
Calcium, Ion: 1.07 mmol/L — ABNORMAL LOW (ref 1.15–1.40)
HCT: 35 % — ABNORMAL LOW (ref 39.0–52.0)
HCT: 26 % — ABNORMAL LOW (ref 39.0–52.0)
Hemoglobin: 11.9 g/dL — ABNORMAL LOW (ref 13.0–17.0)
Hemoglobin: 8.8 g/dL — ABNORMAL LOW (ref 13.0–17.0)
O2 Saturation: 99 %
O2 Saturation: 86 %
Patient temperature: 99.9
Patient temperature: 37.4
Potassium: 5.2 mmol/L — ABNORMAL HIGH (ref 3.5–5.1)
Potassium: 4.4 mmol/L (ref 3.5–5.1)
Sodium: 136 mmol/L (ref 135–145)
Sodium: 136 mmol/L (ref 135–145)
TCO2: 33 mmol/L — ABNORMAL HIGH (ref 22–32)
TCO2: 30 mmol/L (ref 22–32)
pCO2 arterial: 58 mmHg — ABNORMAL HIGH (ref 32–48)
pCO2 arterial: 56.2 mmHg — ABNORMAL HIGH (ref 32–48)
pH, Arterial: 7.34 — ABNORMAL LOW (ref 7.35–7.45)
pH, Arterial: 7.319 — ABNORMAL LOW (ref 7.35–7.45)
pO2, Arterial: 129 mmHg — ABNORMAL HIGH (ref 83–108)
pO2, Arterial: 58 mmHg — ABNORMAL LOW (ref 83–108)

## 2021-05-15 LAB — TYPE AND SCREEN
ABO/RH(D): O POS
Antibody Screen: NEGATIVE
Unit division: 0
Unit division: 0

## 2021-05-15 LAB — BASIC METABOLIC PANEL
Anion gap: 7 (ref 5–15)
BUN: 26 mg/dL — ABNORMAL HIGH (ref 6–20)
CO2: 25 mmol/L (ref 22–32)
Calcium: 7 mg/dL — ABNORMAL LOW (ref 8.9–10.3)
Chloride: 100 mmol/L (ref 98–111)
Creatinine, Ser: 1.11 mg/dL (ref 0.61–1.24)
GFR, Estimated: >60 mL/min (ref >60)
Glucose, Bld: 121 mg/dL — ABNORMAL HIGH (ref 70–99)
Potassium: 4.6 mmol/L (ref 3.5–5.1)
Sodium: 132 mmol/L — ABNORMAL LOW (ref 135–145)

## 2021-05-15 LAB — CBC
HCT: 23.8 % — ABNORMAL LOW (ref 39.0–52.0)
Hemoglobin: 7.8 g/dL — ABNORMAL LOW (ref 13.0–17.0)
MCH: 30.6 pg (ref 26.0–34.0)
MCHC: 32.8 g/dL (ref 30.0–36.0)
MCV: 93.3 fL (ref 80.0–100.0)
Platelets: 441 10*3/uL — ABNORMAL HIGH (ref 150–400)
RBC: 2.55 MIL/uL — ABNORMAL LOW (ref 4.22–5.81)
RDW: 16.1 % — ABNORMAL HIGH (ref 11.5–15.5)
WBC: 23.7 10*3/uL — ABNORMAL HIGH (ref 4.0–10.5)
nRBC: 0.5 % — ABNORMAL HIGH (ref 0.0–0.2)

## 2021-05-15 LAB — GLUCOSE, CAPILLARY
Glucose-Capillary: 132 mg/dL — ABNORMAL HIGH (ref 70–99)
Glucose-Capillary: 117 mg/dL — ABNORMAL HIGH (ref 70–99)
Glucose-Capillary: 165 mg/dL — ABNORMAL HIGH (ref 70–99)
Glucose-Capillary: 155 mg/dL — ABNORMAL HIGH (ref 70–99)
Glucose-Capillary: 140 mg/dL — ABNORMAL HIGH (ref 70–99)
Glucose-Capillary: 143 mg/dL — ABNORMAL HIGH (ref 70–99)

## 2021-05-15 LAB — MAGNESIUM: Magnesium: 2.7 mg/dL — ABNORMAL HIGH (ref 1.7–2.4)

## 2021-05-15 MED ORDER — FUROSEMIDE 10 MG/ML IJ SOLN
60.0000 mg | Freq: Once | INTRAMUSCULAR | Status: AC
  Administered 2021-05-15: 60 mg via INTRAVENOUS
  Filled 2021-05-15: qty 6

## 2021-05-15 MED ORDER — LINEZOLID 600 MG/300ML IV SOLN
600.0000 mg | Freq: Two times a day (BID) | INTRAVENOUS | Status: DC
  Administered 2021-05-15 – 2021-05-24 (×18): 600 mg via INTRAVENOUS
  Filled 2021-05-15 (×19): qty 300

## 2021-05-15 MED ORDER — NICARDIPINE HCL IN NACL 40-0.83 MG/200ML-% IV SOLN
3.0000 mg/h | INTRAVENOUS | Status: DC
  Administered 2021-05-15: 12 mg/h via INTRAVENOUS
  Administered 2021-05-15 (×2): 10 mg/h via INTRAVENOUS
  Administered 2021-05-15: 12 mg/h via INTRAVENOUS
  Administered 2021-05-16: 5 mg/h via INTRAVENOUS
  Filled 2021-05-15 (×4): qty 200

## 2021-05-15 MED ORDER — CLONIDINE HCL 0.2 MG PO TABS
0.2000 mg | ORAL_TABLET | Freq: Three times a day (TID) | ORAL | Status: DC
  Administered 2021-05-15 – 2021-05-16 (×4): 0.2 mg
  Filled 2021-05-15 (×4): qty 1

## 2021-05-15 MED ORDER — AMLODIPINE BESYLATE 10 MG PO TABS
10.0000 mg | ORAL_TABLET | Freq: Every day | ORAL | Status: DC
  Administered 2021-05-15 – 2021-05-19 (×5): 10 mg
  Filled 2021-05-15 (×7): qty 1
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
## 2021-05-15 NOTE — Procedures (Signed)
Arterial Catheter Insertion Procedure Note  Nathan West  [id number]  Dec 20, 1990  Date:05/15/21  Time:6:13 PM    Provider Performing: Cheri Fowler    Procedure: Insertion of Arterial Line (12878) with US guidance (67672)   Indication(s) Blood pressure monitoring and/or need for frequent ABGs  Consent Risks of the procedure as well as the alternatives and risks of each were explained to the patient and/or caregiver.  Consent for the procedure was obtained and is signed in the bedside chart  Anesthesia None   Time Out Verified patient identification, verified procedure, site/side was marked, verified correct patient position, special equipment/implants available, medications/allergies/relevant history reviewed, required imaging and test results available.   Sterile Technique Maximal sterile technique including full sterile barrier drape, hand hygiene, sterile gown, sterile gloves, mask, hair covering, sterile ultrasound probe cover (if used).   Procedure Description Area of catheter insertion was cleaned with chlorhexidine and draped in sterile fashion. With real-time ultrasound guidance an arterial catheter was placed into the right  Axillary  artery.  Appropriate arterial tracings confirmed on monitor.     Complications/Tolerance None; patient tolerated the procedure well.   EBL Minimal   Specimen(s) None

## 2021-05-15 NOTE — Progress Notes (Signed)
Suction catheter able to be passed with ease while pt in prone position.

## 2021-05-15 NOTE — Progress Notes (Signed)
Pt supined at this time. RT and RN x5 assisted in turning patient. Pt tolerated well. RT will continue to monitor and be available as needed.

## 2021-05-15 NOTE — Procedures (Signed)
Bronchoscopy Procedure Note  Nathan West  [id number]  1990/10/18  Date:05/15/21  Time:10:50 AM   Provider Performing:Antionne Enrique   Procedure(s):  Flexible bronchoscopy with bronchial alveolar lavage (08657) and Subsequent Therapeutic Aspiration of Tracheobronchial Tree (84696)  Indication(s) ARDS  Consent Risks of the procedure as well as the alternatives and risks of each were explained to the patient and/or caregiver.  Consent for the procedure was obtained and is signed in the bedside chart  Anesthesia Propofol and vecuronium   Time Out Verified patient identification, verified procedure, site/side was marked, verified correct patient position, special equipment/implants available, medications/allergies/relevant history reviewed, required imaging and test results available.   Sterile Technique Usual hand hygiene, masks, gowns, and gloves were used   Procedure Description Bronchoscope advanced through endotracheal tube and into airway.  Airways were examined down to subsegmental level with findings noted below.   Following diagnostic evaluation, BAL(s) performed in right lower lobe with normal saline and return of 5 cc yellow-colored fluid and Therapeutic aspiration performed in right middle lobe/type lower lobe  Findings: Copious amount of thick yellowish secretions noted in right middle and right lower lobe   Complications/Tolerance None; patient tolerated the procedure well. Chest X-ray is not needed post procedure.   EBL Minimal   Specimen(s)

## 2021-05-15 NOTE — Progress Notes (Signed)
  Echocardiogram 2D Echocardiogram has been performed.  Nathan West 05/15/2021, 4:32 PM

## 2021-05-15 NOTE — Progress Notes (Signed)
RT assisted Dr. Merrily Pew with bedside bronchoscopy. Pt tolerated procedure well. RT will continue to monitor and be available as needed.

## 2021-05-15 NOTE — Progress Notes (Signed)
Pts cloth tape and mepilex pads removed. New mepilex pads and cloth tape applied and ETT resecured at 27cm. No new breakdown noted to the tongue, lips or cheeks. Pt then proned by RT x2 and RN x5. Pt tolerated well. RT will continue to monitor and be available as needed.

## 2021-05-15 NOTE — Progress Notes (Signed)
ABG results given to Dr. Tacy Learn. Plans to reprone pt when staffing available. RT will continue to monitor and be available as needed.

## 2021-05-16 ENCOUNTER — Inpatient Hospital Stay (HOSPITAL_COMMUNITY): Payer: Medicaid Other

## 2021-05-16 DIAGNOSIS — J942 Hemothorax: Secondary | ICD-10-CM

## 2021-05-16 DIAGNOSIS — G9340 Encephalopathy, unspecified: Secondary | ICD-10-CM

## 2021-05-16 DIAGNOSIS — D500 Iron deficiency anemia secondary to blood loss (chronic): Secondary | ICD-10-CM

## 2021-05-16 DIAGNOSIS — T07XXXA Unspecified multiple injuries, initial encounter: Secondary | ICD-10-CM

## 2021-05-16 DIAGNOSIS — J80 Acute respiratory distress syndrome: Secondary | ICD-10-CM

## 2021-05-16 LAB — POCT I-STAT 7, (LYTES, BLD GAS, ICA,H+H)
Acid-Base Excess: 6 mmol/L — ABNORMAL HIGH (ref 0.0–2.0)
Acid-Base Excess: 6 mmol/L — ABNORMAL HIGH (ref 0.0–2.0)
Acid-Base Excess: 5 mmol/L — ABNORMAL HIGH (ref 0.0–2.0)
Bicarbonate: 32.5 mmol/L — ABNORMAL HIGH (ref 20.0–28.0)
Bicarbonate: 31.3 mmol/L — ABNORMAL HIGH (ref 20.0–28.0)
Bicarbonate: 32.1 mmol/L — ABNORMAL HIGH (ref 20.0–28.0)
Calcium, Ion: 1.11 mmol/L — ABNORMAL LOW (ref 1.15–1.40)
Calcium, Ion: 1.12 mmol/L — ABNORMAL LOW (ref 1.15–1.40)
Calcium, Ion: 1.16 mmol/L (ref 1.15–1.40)
HCT: 25 % — ABNORMAL LOW (ref 39.0–52.0)
HCT: 25 % — ABNORMAL LOW (ref 39.0–52.0)
HCT: 26 % — ABNORMAL LOW (ref 39.0–52.0)
Hemoglobin: 8.5 g/dL — ABNORMAL LOW (ref 13.0–17.0)
Hemoglobin: 8.5 g/dL — ABNORMAL LOW (ref 13.0–17.0)
Hemoglobin: 8.8 g/dL — ABNORMAL LOW (ref 13.0–17.0)
O2 Saturation: 99 %
O2 Saturation: 93 %
O2 Saturation: 91 %
Patient temperature: 37.5
Patient temperature: 38.1
Patient temperature: 38.7
Potassium: 4.2 mmol/L (ref 3.5–5.1)
Potassium: 4.3 mmol/L (ref 3.5–5.1)
Potassium: 4.1 mmol/L (ref 3.5–5.1)
Sodium: 136 mmol/L (ref 135–145)
Sodium: 138 mmol/L (ref 135–145)
Sodium: 137 mmol/L (ref 135–145)
TCO2: 34 mmol/L — ABNORMAL HIGH (ref 22–32)
TCO2: 33 mmol/L — ABNORMAL HIGH (ref 22–32)
TCO2: 34 mmol/L — ABNORMAL HIGH (ref 22–32)
pCO2 arterial: 59.8 mmHg — ABNORMAL HIGH (ref 32–48)
pCO2 arterial: 52.4 mmHg — ABNORMAL HIGH (ref 32–48)
pCO2 arterial: 66.1 mmHg (ref 32–48)
pH, Arterial: 7.345 — ABNORMAL LOW (ref 7.35–7.45)
pH, Arterial: 7.389 (ref 7.35–7.45)
pH, Arterial: 7.303 — ABNORMAL LOW (ref 7.35–7.45)
pO2, Arterial: 156 mmHg — ABNORMAL HIGH (ref 83–108)
pO2, Arterial: 75 mmHg — ABNORMAL LOW (ref 83–108)
pO2, Arterial: 76 mmHg — ABNORMAL LOW (ref 83–108)

## 2021-05-16 LAB — GLUCOSE, CAPILLARY
Glucose-Capillary: 131 mg/dL — ABNORMAL HIGH (ref 70–99)
Glucose-Capillary: 134 mg/dL — ABNORMAL HIGH (ref 70–99)
Glucose-Capillary: 139 mg/dL — ABNORMAL HIGH (ref 70–99)
Glucose-Capillary: 148 mg/dL — ABNORMAL HIGH (ref 70–99)
Glucose-Capillary: 152 mg/dL — ABNORMAL HIGH (ref 70–99)
Glucose-Capillary: 157 mg/dL — ABNORMAL HIGH (ref 70–99)

## 2021-05-16 LAB — BASIC METABOLIC PANEL
Anion gap: 9 (ref 5–15)
Anion gap: 8 (ref 5–15)
BUN: 32 mg/dL — ABNORMAL HIGH (ref 6–20)
BUN: 29 mg/dL — ABNORMAL HIGH (ref 6–20)
CO2: 27 mmol/L (ref 22–32)
CO2: 29 mmol/L (ref 22–32)
Calcium: 7.9 mg/dL — ABNORMAL LOW (ref 8.9–10.3)
Calcium: 8.2 mg/dL — ABNORMAL LOW (ref 8.9–10.3)
Chloride: 99 mmol/L (ref 98–111)
Chloride: 101 mmol/L (ref 98–111)
Creatinine, Ser: 1.21 mg/dL (ref 0.61–1.24)
Creatinine, Ser: 1.06 mg/dL (ref 0.61–1.24)
GFR, Estimated: >60 mL/min (ref >60)
GFR, Estimated: >60 mL/min (ref >60)
Glucose, Bld: 146 mg/dL — ABNORMAL HIGH (ref 70–99)
Glucose, Bld: 162 mg/dL — ABNORMAL HIGH (ref 70–99)
Potassium: 3.9 mmol/L (ref 3.5–5.1)
Potassium: 4 mmol/L (ref 3.5–5.1)
Sodium: 135 mmol/L (ref 135–145)
Sodium: 138 mmol/L (ref 135–145)

## 2021-05-16 LAB — CBC
HCT: 25.7 % — ABNORMAL LOW (ref 39.0–52.0)
Hemoglobin: 7.9 g/dL — ABNORMAL LOW (ref 13.0–17.0)
MCH: 28.5 pg (ref 26.0–34.0)
MCHC: 30.7 g/dL (ref 30.0–36.0)
MCV: 92.8 fL (ref 80.0–100.0)
Platelets: 499 10*3/uL — ABNORMAL HIGH (ref 150–400)
RBC: 2.77 MIL/uL — ABNORMAL LOW (ref 4.22–5.81)
RDW: 16.1 % — ABNORMAL HIGH (ref 11.5–15.5)
WBC: 28.1 10*3/uL — ABNORMAL HIGH (ref 4.0–10.5)
nRBC: 0.4 % — ABNORMAL HIGH (ref 0.0–0.2)

## 2021-05-16 LAB — TRIGLYCERIDES: Triglycerides: 399 mg/dL — ABNORMAL HIGH (ref <150)

## 2021-05-16 LAB — MAGNESIUM: Magnesium: 2.9 mg/dL — ABNORMAL HIGH (ref 1.7–2.4)

## 2021-05-16 MED ORDER — CISATRACURIUM BOLUS VIA INFUSION
0.1000 mg/kg | Freq: Once | INTRAVENOUS | Status: AC
  Administered 2021-05-16: 9.2 mg via INTRAVENOUS
  Filled 2021-05-16: qty 10

## 2021-05-16 MED ORDER — DIAZEPAM 5 MG PO TABS
5.0000 mg | ORAL_TABLET | Freq: Four times a day (QID) | ORAL | Status: DC
  Administered 2021-05-16 – 2021-05-20 (×14): 5 mg
  Filled 2021-05-16 (×14): qty 1

## 2021-05-16 MED ORDER — FENTANYL BOLUS VIA INFUSION
50.0000 ug | INTRAVENOUS | Status: DC | PRN
  Administered 2021-05-20 (×2): 50 ug via INTRAVENOUS

## 2021-05-16 MED ORDER — METOPROLOL TARTRATE 25 MG PO TABS: 12.5000 mg | ORAL_TABLET | Freq: Two times a day (BID) | ORAL | Status: DC

## 2021-05-16 MED ORDER — FUROSEMIDE 10 MG/ML IJ SOLN
60.0000 mg | Freq: Once | INTRAMUSCULAR | Status: AC
  Administered 2021-05-16: 60 mg via INTRAVENOUS
  Filled 2021-05-16: qty 6

## 2021-05-16 MED ORDER — METOPROLOL TARTRATE 5 MG/5ML IV SOLN
INTRAVENOUS | Status: AC
  Filled 2021-05-16: qty 5

## 2021-05-16 MED ORDER — CISATRACURIUM BESYLATE 20 MG/10ML IV SOLN: 0.1000 mg/kg | INTRAVENOUS | Status: DC | PRN

## 2021-05-16 MED ORDER — CLONIDINE HCL 0.2 MG PO TABS
0.3000 mg | ORAL_TABLET | Freq: Three times a day (TID) | ORAL | Status: DC
  Administered 2021-05-16 – 2021-05-20 (×12): 0.3 mg
  Filled 2021-05-16 (×14): qty 1

## 2021-05-16 MED ORDER — SODIUM CHLORIDE 0.9 % IV SOLN
0.0000 ug/kg/min | INTRAVENOUS | Status: DC
  Administered 2021-05-16: 3 ug/kg/min via INTRAVENOUS
  Filled 2021-05-16: qty 20

## 2021-05-16 MED ORDER — METOPROLOL TARTRATE 25 MG PO TABS
12.5000 mg | ORAL_TABLET | Freq: Two times a day (BID) | ORAL | Status: DC
  Administered 2021-05-16 – 2021-05-18 (×5): 12.5 mg
  Filled 2021-05-16 (×5): qty 1

## 2021-05-16 MED ORDER — MIDAZOLAM-SODIUM CHLORIDE 100-0.9 MG/100ML-% IV SOLN
2.0000 mg/h | INTRAVENOUS | Status: DC
  Administered 2021-05-16: 2 mg/h via INTRAVENOUS
  Administered 2021-05-17 – 2021-05-20 (×7): 10 mg/h via INTRAVENOUS
  Administered 2021-05-20 (×2): 20 mg/h via INTRAVENOUS
  Administered 2021-05-20: 10 mg/h via INTRAVENOUS
  Administered 2021-05-21: 9 mg/h via INTRAVENOUS
  Administered 2021-05-21: 20 mg/h via INTRAVENOUS
  Administered 2021-05-21: 13 mg/h via INTRAVENOUS
  Administered 2021-05-21: 20 mg/h via INTRAVENOUS
  Administered 2021-05-22 – 2021-05-23 (×5): 10 mg/h via INTRAVENOUS
  Administered 2021-05-24: 13 mg/h via INTRAVENOUS
  Administered 2021-05-24 (×2): 14 mg/h via INTRAVENOUS
  Administered 2021-05-25: 12 mg/h via INTRAVENOUS
  Administered 2021-05-25: 14 mg/h via INTRAVENOUS
  Filled 2021-05-16 (×25): qty 100

## 2021-05-16 MED ORDER — METOPROLOL TARTRATE 5 MG/5ML IV SOLN
5.0000 mg | Freq: Four times a day (QID) | INTRAVENOUS | Status: DC | PRN
  Administered 2021-05-16 – 2021-05-17 (×2): 5 mg via INTRAVENOUS
  Filled 2021-05-16: qty 5

## 2021-05-16 MED ORDER — MIDAZOLAM BOLUS VIA INFUSION
1.0000 mg | INTRAVENOUS | Status: DC | PRN
  Administered 2021-05-18: 1 mg via INTRAVENOUS
  Administered 2021-05-20: 2 mg via INTRAVENOUS
  Administered 2021-05-21: 1 mg via INTRAVENOUS
  Administered 2021-05-22: 2 mg via INTRAVENOUS

## 2021-05-16 MED ORDER — FUROSEMIDE 10 MG/ML IJ SOLN
60.0000 mg | Freq: Once | INTRAMUSCULAR | Status: AC
  Administered 2021-05-17: 60 mg via INTRAVENOUS
  Filled 2021-05-16: qty 6

## 2021-05-16 MED ORDER — FENTANYL CITRATE PF 50 MCG/ML IJ SOSY: 50.0000 ug | PREFILLED_SYRINGE | Freq: Once | INTRAMUSCULAR | Status: DC

## 2021-05-16 MED ORDER — FENTANYL CITRATE (PF) 2500 MCG/50ML IJ SOLN
50.0000 ug/h | Status: DC
  Administered 2021-05-16 – 2021-05-18 (×3): 200 ug/h via INTRAVENOUS
  Administered 2021-05-19 – 2021-05-20 (×3): 300 ug/h via INTRAVENOUS
  Filled 2021-05-16 (×2): qty 100
  Filled 2021-05-16: qty 50
  Filled 2021-05-16 (×5): qty 100

## 2021-05-16 MED ORDER — CALCIUM GLUCONATE-NACL 1-0.675 GM/50ML-% IV SOLN
1.0000 g | Freq: Once | INTRAVENOUS | Status: AC
  Administered 2021-05-16: 1000 mg via INTRAVENOUS
  Filled 2021-05-16: qty 50

## 2021-05-16 MED ORDER — VECURONIUM BROMIDE 10 MG IV SOLR
0.1000 mg/kg | INTRAVENOUS | Status: DC | PRN
  Administered 2021-05-17: 9.2 mg via INTRAVENOUS
  Filled 2021-05-16 (×2): qty 10

## 2021-05-16 MED ORDER — FENTANYL 2500MCG IN NS 250ML (10MCG/ML) PREMIX INFUSION
50.0000 ug/h | INTRAVENOUS | Status: DC
  Administered 2021-05-16: 200 ug/h via INTRAVENOUS
  Filled 2021-05-16: qty 250
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
## 2021-05-16 NOTE — Progress Notes (Signed)
Patient transported from 4N25 to CT and back with no complications noted. °

## 2021-05-16 NOTE — Progress Notes (Signed)
Cloth tape removed from pt at this time. ETT hollister placed on pt at this time. Small abrasion to the right upper cheek as previous, no other breakdown noted to the cheeks, lips or tongue at this time. Pts ETT resecured at 27cm at the lip. Pt tolerated well. RT will continue to monitor and be available as needed.
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
## 2021-05-16 NOTE — Progress Notes (Signed)
Pt supined by Rt x's 2 and RN x's 4. No complications noted.

## 2021-05-17 ENCOUNTER — Inpatient Hospital Stay (HOSPITAL_COMMUNITY): Payer: Medicaid Other

## 2021-05-17 LAB — GLUCOSE, CAPILLARY
Glucose-Capillary: 122 mg/dL — ABNORMAL HIGH (ref 70–99)
Glucose-Capillary: 126 mg/dL — ABNORMAL HIGH (ref 70–99)
Glucose-Capillary: 137 mg/dL — ABNORMAL HIGH (ref 70–99)
Glucose-Capillary: 148 mg/dL — ABNORMAL HIGH (ref 70–99)
Glucose-Capillary: 148 mg/dL — ABNORMAL HIGH (ref 70–99)
Glucose-Capillary: 157 mg/dL — ABNORMAL HIGH (ref 70–99)

## 2021-05-17 LAB — POCT I-STAT 7, (LYTES, BLD GAS, ICA,H+H)
Acid-Base Excess: 11 mmol/L — ABNORMAL HIGH (ref 0.0–2.0)
Bicarbonate: 36.1 mmol/L — ABNORMAL HIGH (ref 20.0–28.0)
Calcium, Ion: 1.16 mmol/L (ref 1.15–1.40)
HCT: 27 % — ABNORMAL LOW (ref 39.0–52.0)
Hemoglobin: 9.2 g/dL — ABNORMAL LOW (ref 13.0–17.0)
O2 Saturation: 82 %
Patient temperature: 101.7
Potassium: 3.6 mmol/L (ref 3.5–5.1)
Sodium: 141 mmol/L (ref 135–145)
TCO2: 38 mmol/L — ABNORMAL HIGH (ref 22–32)
pCO2 arterial: 56.6 mmHg — ABNORMAL HIGH (ref 32–48)
pH, Arterial: 7.42 (ref 7.35–7.45)
pO2, Arterial: 52 mmHg — ABNORMAL LOW (ref 83–108)

## 2021-05-17 LAB — PHOSPHORUS: Phosphorus: 2 mg/dL — ABNORMAL LOW (ref 2.5–4.6)

## 2021-05-17 LAB — TRIGLYCERIDES: Triglycerides: 237 mg/dL — ABNORMAL HIGH (ref <150)

## 2021-05-17 LAB — CBC
HCT: 26.8 % — ABNORMAL LOW (ref 39.0–52.0)
Hemoglobin: 8.6 g/dL — ABNORMAL LOW (ref 13.0–17.0)
MCH: 29.6 pg (ref 26.0–34.0)
MCHC: 32.1 g/dL (ref 30.0–36.0)
MCV: 92.1 fL (ref 80.0–100.0)
Platelets: 550 10*3/uL — ABNORMAL HIGH (ref 150–400)
RBC: 2.91 MIL/uL — ABNORMAL LOW (ref 4.22–5.81)
RDW: 16 % — ABNORMAL HIGH (ref 11.5–15.5)
WBC: 27.7 10*3/uL — ABNORMAL HIGH (ref 4.0–10.5)
nRBC: 0.3 % — ABNORMAL HIGH (ref 0.0–0.2)

## 2021-05-17 LAB — BASIC METABOLIC PANEL
Anion gap: 8 (ref 5–15)
BUN: 31 mg/dL — ABNORMAL HIGH (ref 6–20)
CO2: 31 mmol/L (ref 22–32)
Calcium: 8.4 mg/dL — ABNORMAL LOW (ref 8.9–10.3)
Chloride: 100 mmol/L (ref 98–111)
Creatinine, Ser: 1 mg/dL (ref 0.61–1.24)
GFR, Estimated: >60 mL/min (ref >60)
Glucose, Bld: 153 mg/dL — ABNORMAL HIGH (ref 70–99)
Potassium: 3.5 mmol/L (ref 3.5–5.1)
Sodium: 139 mmol/L (ref 135–145)

## 2021-05-17 LAB — CULTURE, RESPIRATORY W GRAM STAIN

## 2021-05-17 LAB — MAGNESIUM: Magnesium: 2.3 mg/dL (ref 1.7–2.4)

## 2021-05-17 MED ORDER — FUROSEMIDE 10 MG/ML IJ SOLN
80.0000 mg | Freq: Once | INTRAMUSCULAR | Status: AC
  Administered 2021-05-17: 80 mg via INTRAVENOUS
  Filled 2021-05-17: qty 8

## 2021-05-17 MED ORDER — POTASSIUM PHOSPHATES 15 MMOLE/5ML IV SOLN
15.0000 mmol | Freq: Once | INTRAVENOUS | Status: AC
  Administered 2021-05-17: 15 mmol via INTRAVENOUS
  Filled 2021-05-17: qty 5

## 2021-05-17 MED ORDER — METOPROLOL TARTRATE 5 MG/5ML IV SOLN
5.0000 mg | Freq: Four times a day (QID) | INTRAVENOUS | Status: DC | PRN
Start: 2021-05-17 — End: 2021-05-18
  Administered 2021-05-18: 5 mg via INTRAVENOUS
  Filled 2021-05-17: qty 5

## 2021-05-17 MED ORDER — VECURONIUM BROMIDE 10 MG IV SOLR
0.0000 ug/kg/min | Status: DC
  Administered 2021-05-17 (×2): 1 ug/kg/min via INTRAVENOUS
  Administered 2021-05-18 (×2): 1.5 ug/kg/min via INTRAVENOUS
  Administered 2021-05-19 (×2): 1.6 ug/kg/min via INTRAVENOUS
  Administered 2021-05-20 – 2021-05-24 (×11): 1.7 ug/kg/min via INTRAVENOUS
  Filled 2021-05-17 (×19): qty 100

## 2021-05-17 MED ORDER — VECURONIUM BOLUS VIA INFUSION
10.0000 mg | Freq: Once | INTRAVENOUS | Status: AC
  Administered 2021-05-17: 10 mg via INTRAVENOUS
  Filled 2021-05-17: qty 10

## 2021-05-17 NOTE — Progress Notes (Signed)
Patent's arms repositioned and head turned to right by RNx2 and RT at this time. ETT in good position. No complications noted.

## 2021-05-17 NOTE — Progress Notes (Addendum)
Pts ETT holister was removed, mepilex pads applied to pts cheeks and neck.. Pt has small abrasion to the right upper cheek and small abrasion on the side of his neck and back of his neck. ETT secured with tape.Pt then proned, RT x 2, RN x4 at bedside. No complications noted during proning, suction catheter able to pass.
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
## 2021-05-17 NOTE — Progress Notes (Signed)
Patient's head turned to the left with RT & 3 RNs. Patient's arms rotated as well. Suction catheter passes with ease.

## 2021-05-17 NOTE — Progress Notes (Signed)
Patient's arms repositioned and head rotated to left by RNx2 and RT. No complications noted.

## 2021-05-17 NOTE — Progress Notes (Signed)
Pts head turned to the right with RT x 1 and RN x 2. Pts arms also rotated. Suction catheter passes with no complications.

## 2021-05-17 NOTE — Progress Notes (Signed)
Pts head turned to left with RT x 1 and RN x 1. Pts arms also rotated. Suction catheter passes with no complications.
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
## 2021-05-17 NOTE — Progress Notes (Signed)
Cortrak Tube Team Note:  Discussed bridle with RN. Pt now requiring prone positioning.  Bridle removed, new bridle placed and left long to allow the bridle to move with the patient when being proned.  New bridle in place, cortrak tube remains at 72 cm marking as previously charted.    Cammy Copa., RD, LDN, CNSC See AMiON for contact information

## 2021-05-18 ENCOUNTER — Inpatient Hospital Stay (HOSPITAL_COMMUNITY): Payer: Medicaid Other

## 2021-05-18 DIAGNOSIS — R0902 Hypoxemia: Secondary | ICD-10-CM

## 2021-05-18 DIAGNOSIS — J15212 Pneumonia due to Methicillin resistant Staphylococcus aureus: Secondary | ICD-10-CM

## 2021-05-18 DIAGNOSIS — J984 Other disorders of lung: Secondary | ICD-10-CM

## 2021-05-18 DIAGNOSIS — J9312 Secondary spontaneous pneumothorax: Secondary | ICD-10-CM

## 2021-05-18 DIAGNOSIS — L8990 Pressure ulcer of unspecified site, unspecified stage: Secondary | ICD-10-CM | POA: Insufficient documentation

## 2021-05-18 DIAGNOSIS — J14 Pneumonia due to Hemophilus influenzae: Secondary | ICD-10-CM

## 2021-05-18 LAB — POCT I-STAT 7, (LYTES, BLD GAS, ICA,H+H)
Acid-Base Excess: 15 mmol/L — ABNORMAL HIGH (ref 0.0–2.0)
Acid-Base Excess: 12 mmol/L — ABNORMAL HIGH (ref 0.0–2.0)
Acid-Base Excess: 11 mmol/L — ABNORMAL HIGH (ref 0.0–2.0)
Acid-Base Excess: 11 mmol/L — ABNORMAL HIGH (ref 0.0–2.0)
Bicarbonate: 39.8 mmol/L — ABNORMAL HIGH (ref 20.0–28.0)
Bicarbonate: 39 mmol/L — ABNORMAL HIGH (ref 20.0–28.0)
Bicarbonate: 38.8 mmol/L — ABNORMAL HIGH (ref 20.0–28.0)
Bicarbonate: 39.2 mmol/L — ABNORMAL HIGH (ref 20.0–28.0)
Calcium, Ion: 1.17 mmol/L (ref 1.15–1.40)
Calcium, Ion: 1.18 mmol/L (ref 1.15–1.40)
Calcium, Ion: 1.19 mmol/L (ref 1.15–1.40)
Calcium, Ion: 1.19 mmol/L (ref 1.15–1.40)
HCT: 26 % — ABNORMAL LOW (ref 39.0–52.0)
HCT: 29 % — ABNORMAL LOW (ref 39.0–52.0)
HCT: 26 % — ABNORMAL LOW (ref 39.0–52.0)
HCT: 26 % — ABNORMAL LOW (ref 39.0–52.0)
Hemoglobin: 8.8 g/dL — ABNORMAL LOW (ref 13.0–17.0)
Hemoglobin: 9.9 g/dL — ABNORMAL LOW (ref 13.0–17.0)
Hemoglobin: 8.8 g/dL — ABNORMAL LOW (ref 13.0–17.0)
Hemoglobin: 8.8 g/dL — ABNORMAL LOW (ref 13.0–17.0)
O2 Saturation: 96 %
O2 Saturation: 88 %
O2 Saturation: 94 %
O2 Saturation: 92 %
Patient temperature: 99.3
Patient temperature: 38.3
Patient temperature: 38.2
Potassium: 3.6 mmol/L (ref 3.5–5.1)
Potassium: 4.4 mmol/L (ref 3.5–5.1)
Potassium: 4.8 mmol/L (ref 3.5–5.1)
Potassium: 4.8 mmol/L (ref 3.5–5.1)
Sodium: 141 mmol/L (ref 135–145)
Sodium: 143 mmol/L (ref 135–145)
Sodium: 144 mmol/L (ref 135–145)
Sodium: 144 mmol/L (ref 135–145)
TCO2: 41 mmol/L — ABNORMAL HIGH (ref 22–32)
TCO2: 41 mmol/L — ABNORMAL HIGH (ref 22–32)
TCO2: 41 mmol/L — ABNORMAL HIGH (ref 22–32)
TCO2: 41 mmol/L — ABNORMAL HIGH (ref 22–32)
pCO2 arterial: 54.5 mmHg — ABNORMAL HIGH (ref 32–48)
pCO2 arterial: 70.2 mmHg (ref 32–48)
pCO2 arterial: 70.7 mmHg (ref 32–48)
pCO2 arterial: 78.1 mmHg (ref 32–48)
pH, Arterial: 7.473 — ABNORMAL HIGH (ref 7.35–7.45)
pH, Arterial: 7.359 (ref 7.35–7.45)
pH, Arterial: 7.347 — ABNORMAL LOW (ref 7.35–7.45)
pH, Arterial: 7.315 — ABNORMAL LOW (ref 7.35–7.45)
pO2, Arterial: 83 mmHg (ref 83–108)
pO2, Arterial: 65 mmHg — ABNORMAL LOW (ref 83–108)
pO2, Arterial: 78 mmHg — ABNORMAL LOW (ref 83–108)
pO2, Arterial: 76 mmHg — ABNORMAL LOW (ref 83–108)

## 2021-05-18 LAB — GLUCOSE, CAPILLARY
Glucose-Capillary: 139 mg/dL — ABNORMAL HIGH (ref 70–99)
Glucose-Capillary: 148 mg/dL — ABNORMAL HIGH (ref 70–99)
Glucose-Capillary: 149 mg/dL — ABNORMAL HIGH (ref 70–99)
Glucose-Capillary: 165 mg/dL — ABNORMAL HIGH (ref 70–99)
Glucose-Capillary: 172 mg/dL — ABNORMAL HIGH (ref 70–99)
Glucose-Capillary: 188 mg/dL — ABNORMAL HIGH (ref 70–99)

## 2021-05-18 MED ORDER — POTASSIUM CHLORIDE 20 MEQ PO PACK
40.0000 meq | PACK | ORAL | Status: AC
  Administered 2021-05-18 (×2): 40 meq
  Filled 2021-05-18 (×2): qty 2

## 2021-05-18 MED ORDER — METOPROLOL TARTRATE 25 MG PO TABS
25.0000 mg | ORAL_TABLET | Freq: Two times a day (BID) | ORAL | Status: DC
  Administered 2021-05-18 – 2021-05-22 (×8): 25 mg
  Filled 2021-05-18 (×8): qty 1

## 2021-05-18 MED ORDER — METOPROLOL TARTRATE 5 MG/5ML IV SOLN
5.0000 mg | Freq: Four times a day (QID) | INTRAVENOUS | Status: DC | PRN
Start: 2021-05-18 — End: 2021-06-01
  Administered 2021-05-18 – 2021-06-01 (×7): 5 mg via INTRAVENOUS
  Filled 2021-05-18 (×7): qty 5

## 2021-05-18 NOTE — Progress Notes (Incomplete)
After proning patient

## 2021-05-18 NOTE — Progress Notes (Signed)
7.34/71/78/39 on PRVC 32/490/14/100% Saturation 94%, Ppeak 35  PEEP decreased to 12, no change in saturations PEEP decreased to 10, no change in saturations. Vt loss <100cc now, getting returned volumes 370+cc now, often >400cc. Ppeak 32. Air leak has decreased to +1 in atrium, still continuous. HR in 120s.  S/o updated at bedside this evening.  Julian Hy, DO 05/18/21 7:09 PM [facility]
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
## 2021-05-18 NOTE — Progress Notes (Signed)
eLink Physician-Brief Progress Note Patient Name: Nathan West DOB: Mar 26, 1990 MRN: [id number]   Date of Service  05/18/2021  HPI/Events of Note  ABG on 100%/PRVC 32/TV 460/P 10 = 7.315/78.1/76/39.2.  eICU Interventions  Continue present ventilator management.      Intervention Category Major Interventions: Respiratory failure - evaluation and management  Bob Eastwood Eugene 05/18/2021, 10:13 PM

## 2021-05-18 NOTE — Progress Notes (Signed)
Patient's head and arms repositioned at this time by RN and RTx2. No complications noted. 

## 2021-05-18 NOTE — Progress Notes (Addendum)
During 1600 assessment RN noticed a AIR LEAK in the chest tube system. Assessed pleura vac system and tubing for damage, none found. Purulent bloody thick drainage now coming out of chest tube. SpO2 90% on 100% FiO2 and patient HR 140s. Occlusive petroleum dressing applied and STAT chest xray order placed. Dr Cliffton Asters notified.  1647 update: Dr white paged that patient's chest x ray has been done.   Sherral Hammers RN

## 2021-05-18 NOTE — Progress Notes (Signed)
ABG obtained and critical value reported to RN.  PH 7.31 PCO2 78.1 PO2 76  HCO3 39.2 SO2 92%

## 2021-05-18 NOTE — Progress Notes (Signed)
Increase vecuronium gtt to 1.2 due to minor vent desynchrony. Ok to go by synchrony with ventilator and BIS monitoring and not train of four per Dr Janee Morn.  Sherral Hammers RN
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
## 2021-05-18 NOTE — Progress Notes (Signed)
Patient's head rotated to right and arms repositioned by RN and RTx2. No complications noted.

## 2021-05-18 NOTE — Progress Notes (Signed)
Pts ETT holister was removed, mepilex pads applied to pts cheeks and neck. Pt has small abrasion to the right upper cheek and small abrasion on the right side of his neck along with a small abrasion on the back of his neck. ETT was secured with tape. Pt was then proned, RT x 2, RN x 4 at bedside. No complications noted during proning. Suction catheter able to pass.
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
## 2021-05-18 NOTE — Plan of Care (Signed)
Problem: Education: Goal: Knowledge of General Education information will improve Description Including pain rating scale, medication(s)/side effects and non-pharmacologic comfort measures Outcome: Progressing   Problem: Health Behavior/Discharge Planning: Goal: Ability to manage health-related needs will improve Outcome: Progressing   Problem: Activity: Goal: Risk for activity intolerance will decrease Outcome: Progressing   Problem: Nutrition: Goal: Adequate nutrition will be maintained Outcome: Progressing   Problem: Coping: Goal: Level of anxiety will decrease Outcome: Progressing   Problem: Elimination: Goal: Will not experience complications related to urinary retention Outcome: Progressing   Problem: Pain Managment: Goal: General experience of comfort will improve Outcome: Progressing   Problem: Safety: Goal: Ability to remain free from injury will improve Outcome: Progressing   Problem: Skin Integrity: Goal: Risk for impaired skin integrity will decrease Outcome: Progressing   

## 2021-05-18 NOTE — Progress Notes (Signed)
Patient placed in supine position at this time. RT replaced cloth tape with ETT holder. Bilateral breath sounds equal. No complications noted. RT will obtain ABG in 1 hour.

## 2021-05-18 NOTE — Progress Notes (Signed)
Trauma Event Note    TRN at bedside to round. Patient with ARDS, proned since approx 1700, plan to move to supine positioning tomorrow around 0900. intubated/sedated/paralyzed. CCM at bedside tonight to evaluate chest tube leak - decreased PEEP, redressed without improvement. Bedside RN to continue to monitor.   Last imported Vital Signs BP (!) 153/76   Pulse (!) 127   Temp (!) 101.7 F (38.7 C)   Resp (!) 32   Ht 5\' 9"  (1.753 m)   Wt 195 lb 5.2 oz (88.6 kg)   SpO2 92%   BMI 28.84 kg/m   Trending CBC Recent Labs    05/16/21 0609 05/16/21 0821 05/17/21 0450 05/18/21 0318 05/18/21 1147 05/18/21 1900 05/18/21 2059  WBC 28.1*  --  27.7*  --   --   --   --   HGB 7.9*   < > 8.6*   < > 9.9* 8.8* 8.8*  HCT 25.7*   < > 26.8*   < > 29.0* 26.0* 26.0*  PLT 499*  --  550*  --   --   --   --    < > = values in this interval not displayed.    Trending Coag's No results for input(s): APTT, INR in the last 72 hours.  Trending BMET Recent Labs    05/16/21 0609 05/16/21 0821 05/16/21 1829 05/17/21 0407 05/17/21 0450 05/18/21 0318 05/18/21 1147 05/18/21 1900 05/18/21 2059  NA 135   < > 138   < > 139   < > 143 144 144  K 3.9   < > 4.0   < > 3.5   < > 4.4 4.8 4.8  CL 99  --  101  --  100  --   --   --   --   CO2 27  --  29  --  31  --   --   --   --   BUN 32*  --  29*  --  31*  --   --   --   --   CREATININE 1.21  --  1.06  --  1.00  --   --   --   --   GLUCOSE 146*  --  162*  --  153*  --   --   --   --    < > = values in this interval not displayed.      Tedford Berg O Vennela Jutte  Trauma Response RN  Please call TRN at [phone] for further assistance.

## 2021-05-19 ENCOUNTER — Inpatient Hospital Stay (HOSPITAL_COMMUNITY): Payer: Medicaid Other

## 2021-05-19 LAB — BASIC METABOLIC PANEL
Anion gap: 8 (ref 5–15)
BUN: 33 mg/dL — ABNORMAL HIGH (ref 6–20)
CO2: 35 mmol/L — ABNORMAL HIGH (ref 22–32)
Calcium: 8.4 mg/dL — ABNORMAL LOW (ref 8.9–10.3)
Chloride: 103 mmol/L (ref 98–111)
Creatinine, Ser: 0.86 mg/dL (ref 0.61–1.24)
GFR, Estimated: >60 mL/min (ref >60)
Glucose, Bld: 138 mg/dL — ABNORMAL HIGH (ref 70–99)
Potassium: 5.1 mmol/L (ref 3.5–5.1)
Sodium: 146 mmol/L — ABNORMAL HIGH (ref 135–145)

## 2021-05-19 LAB — POCT I-STAT 7, (LYTES, BLD GAS, ICA,H+H)
Acid-Base Excess: 13 mmol/L — ABNORMAL HIGH (ref 0.0–2.0)
Acid-Base Excess: 14 mmol/L — ABNORMAL HIGH (ref 0.0–2.0)
Bicarbonate: 40.5 mmol/L — ABNORMAL HIGH (ref 20.0–28.0)
Bicarbonate: 39.8 mmol/L — ABNORMAL HIGH (ref 20.0–28.0)
Calcium, Ion: 1.18 mmol/L (ref 1.15–1.40)
Calcium, Ion: 1.18 mmol/L (ref 1.15–1.40)
HCT: 24 % — ABNORMAL LOW (ref 39.0–52.0)
HCT: 25 % — ABNORMAL LOW (ref 39.0–52.0)
Hemoglobin: 8.2 g/dL — ABNORMAL LOW (ref 13.0–17.0)
Hemoglobin: 8.5 g/dL — ABNORMAL LOW (ref 13.0–17.0)
O2 Saturation: 93 %
O2 Saturation: 92 %
Patient temperature: 38.4
Potassium: 4.9 mmol/L (ref 3.5–5.1)
Potassium: 4.8 mmol/L (ref 3.5–5.1)
Sodium: 145 mmol/L (ref 135–145)
Sodium: 146 mmol/L — ABNORMAL HIGH (ref 135–145)
TCO2: 43 mmol/L — ABNORMAL HIGH (ref 22–32)
TCO2: 42 mmol/L — ABNORMAL HIGH (ref 22–32)
pCO2 arterial: 76.9 mmHg (ref 32–48)
pCO2 arterial: 59.3 mmHg — ABNORMAL HIGH (ref 32–48)
pH, Arterial: 7.336 — ABNORMAL LOW (ref 7.35–7.45)
pH, Arterial: 7.435 (ref 7.35–7.45)
pO2, Arterial: 83 mmHg (ref 83–108)
pO2, Arterial: 63 mmHg — ABNORMAL LOW (ref 83–108)

## 2021-05-19 LAB — CBC
HCT: 26 % — ABNORMAL LOW (ref 39.0–52.0)
Hemoglobin: 7.5 g/dL — ABNORMAL LOW (ref 13.0–17.0)
MCH: 28.4 pg (ref 26.0–34.0)
MCHC: 28.8 g/dL — ABNORMAL LOW (ref 30.0–36.0)
MCV: 98.5 fL (ref 80.0–100.0)
Platelets: 601 10*3/uL — ABNORMAL HIGH (ref 150–400)
RBC: 2.64 MIL/uL — ABNORMAL LOW (ref 4.22–5.81)
RDW: 16.4 % — ABNORMAL HIGH (ref 11.5–15.5)
WBC: 26.6 10*3/uL — ABNORMAL HIGH (ref 4.0–10.5)
nRBC: 0.7 % — ABNORMAL HIGH (ref 0.0–0.2)

## 2021-05-19 LAB — GLUCOSE, CAPILLARY
Glucose-Capillary: 131 mg/dL — ABNORMAL HIGH (ref 70–99)
Glucose-Capillary: 142 mg/dL — ABNORMAL HIGH (ref 70–99)
Glucose-Capillary: 145 mg/dL — ABNORMAL HIGH (ref 70–99)
Glucose-Capillary: 158 mg/dL — ABNORMAL HIGH (ref 70–99)
Glucose-Capillary: 159 mg/dL — ABNORMAL HIGH (ref 70–99)
Glucose-Capillary: 162 mg/dL — ABNORMAL HIGH (ref 70–99)

## 2021-05-19 NOTE — Progress Notes (Signed)
  PT's ETT holder was removed, mepilex pads applied to pts cheeks and neck. Pt has small abrasion to the right upper cheek and a small abrasion on the back of neck. ETT secured with tape. PT then proned, RT x3, RN x4 at bedside. No complications noted during proning.

## 2021-05-19 NOTE — Progress Notes (Signed)
Patient's head and arms repositioned at this time by RT and RN x2. No complications noted

## 2021-05-19 NOTE — Progress Notes (Signed)
Patient's head and arms repositioned at this time by RNx2 and RT. No complications noted.

## 2021-05-19 NOTE — Progress Notes (Signed)
0915: Following supination of patient, RN noticed no air leak in chest tube. Dr. Chestine Spore notified since leak was continuous prior to flipping patient. RN instructed to monitor HR, BP, SpO2, and ventilator pressures.   97: RN noticed acute drop in SBP from 140s to low 100s on arterial line. Upon auscultation, RN noted right breath sounds to be very diminished and hard to hear compared to left. Dr. Chestine Spore immediately notified, order for stat chest x-ray placed.   1015: Continuous air leak noted on chest tube at "2" marking. Right side breath sounds easier to distinguish inspiratory and expiratory phases compared to earlier. Chest x-ray completed. Dr. Chestine Spore notified of chest x-ray completion
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
## 2021-05-19 NOTE — Progress Notes (Signed)
Patient's head and arms repositioned at this time by RN and RTx2 with no complications noted.

## 2021-05-19 NOTE — Progress Notes (Signed)
Patient's head and arms repositioned at this time by RN and RTx2. No complications noted.
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
## 2021-05-20 LAB — POCT I-STAT 7, (LYTES, BLD GAS, ICA,H+H)
Acid-Base Excess: 12 mmol/L — ABNORMAL HIGH (ref 0.0–2.0)
Acid-Base Excess: 12 mmol/L — ABNORMAL HIGH (ref 0.0–2.0)
Bicarbonate: 39.6 mmol/L — ABNORMAL HIGH (ref 20.0–28.0)
Bicarbonate: 38 mmol/L — ABNORMAL HIGH (ref 20.0–28.0)
Calcium, Ion: 1.17 mmol/L (ref 1.15–1.40)
Calcium, Ion: 1.16 mmol/L (ref 1.15–1.40)
HCT: 24 % — ABNORMAL LOW (ref 39.0–52.0)
HCT: 22 % — ABNORMAL LOW (ref 39.0–52.0)
Hemoglobin: 8.2 g/dL — ABNORMAL LOW (ref 13.0–17.0)
Hemoglobin: 7.5 g/dL — ABNORMAL LOW (ref 13.0–17.0)
O2 Saturation: 91 %
O2 Saturation: 94 %
Patient temperature: 99.5
Potassium: 4.9 mmol/L (ref 3.5–5.1)
Potassium: 5 mmol/L (ref 3.5–5.1)
Sodium: 147 mmol/L — ABNORMAL HIGH (ref 135–145)
Sodium: 147 mmol/L — ABNORMAL HIGH (ref 135–145)
TCO2: 42 mmol/L — ABNORMAL HIGH (ref 22–32)
TCO2: 40 mmol/L — ABNORMAL HIGH (ref 22–32)
pCO2 arterial: 77.3 mmHg (ref 32–48)
pCO2 arterial: 63.2 mmHg — ABNORMAL HIGH (ref 32–48)
pH, Arterial: 7.32 — ABNORMAL LOW (ref 7.35–7.45)
pH, Arterial: 7.387 (ref 7.35–7.45)
pO2, Arterial: 73 mmHg — ABNORMAL LOW (ref 83–108)
pO2, Arterial: 76 mmHg — ABNORMAL LOW (ref 83–108)

## 2021-05-20 LAB — GLUCOSE, CAPILLARY
Glucose-Capillary: 140 mg/dL — ABNORMAL HIGH (ref 70–99)
Glucose-Capillary: 174 mg/dL — ABNORMAL HIGH (ref 70–99)
Glucose-Capillary: 173 mg/dL — ABNORMAL HIGH (ref 70–99)
Glucose-Capillary: 171 mg/dL — ABNORMAL HIGH (ref 70–99)
Glucose-Capillary: 145 mg/dL — ABNORMAL HIGH (ref 70–99)
Glucose-Capillary: 142 mg/dL — ABNORMAL HIGH (ref 70–99)

## 2021-05-20 LAB — CBC
HCT: 25.1 % — ABNORMAL LOW (ref 39.0–52.0)
Hemoglobin: 7.4 g/dL — ABNORMAL LOW (ref 13.0–17.0)
MCH: 29 pg (ref 26.0–34.0)
MCHC: 29.5 g/dL — ABNORMAL LOW (ref 30.0–36.0)
MCV: 98.4 fL (ref 80.0–100.0)
Platelets: 579 10*3/uL — ABNORMAL HIGH (ref 150–400)
RBC: 2.55 MIL/uL — ABNORMAL LOW (ref 4.22–5.81)
RDW: 16.8 % — ABNORMAL HIGH (ref 11.5–15.5)
WBC: 25.3 10*3/uL — ABNORMAL HIGH (ref 4.0–10.5)
nRBC: 1.4 % — ABNORMAL HIGH (ref 0.0–0.2)

## 2021-05-20 LAB — BASIC METABOLIC PANEL
Anion gap: 6 (ref 5–15)
BUN: 39 mg/dL — ABNORMAL HIGH (ref 6–20)
CO2: 37 mmol/L — ABNORMAL HIGH (ref 22–32)
Calcium: 8.5 mg/dL — ABNORMAL LOW (ref 8.9–10.3)
Chloride: 106 mmol/L (ref 98–111)
Creatinine, Ser: 0.96 mg/dL (ref 0.61–1.24)
GFR, Estimated: >60 mL/min (ref >60)
Glucose, Bld: 158 mg/dL — ABNORMAL HIGH (ref 70–99)
Potassium: 5.4 mmol/L — ABNORMAL HIGH (ref 3.5–5.1)
Sodium: 149 mmol/L — ABNORMAL HIGH (ref 135–145)

## 2021-05-20 MED ORDER — METHADONE HCL 10 MG PO TABS
10.0000 mg | ORAL_TABLET | Freq: Three times a day (TID) | ORAL | Status: DC
  Administered 2021-05-20 – 2021-05-21 (×3): 10 mg
  Filled 2021-05-20 (×3): qty 1

## 2021-05-20 MED ORDER — SODIUM CHLORIDE 0.9 % IV SOLN
0.5000 mg/h | INTRAVENOUS | Status: DC
  Administered 2021-05-20 (×2): 4 mg/h via INTRAVENOUS
  Administered 2021-05-21: 3.5 mg/h via INTRAVENOUS
  Administered 2021-05-23: 1 mg/h via INTRAVENOUS
  Administered 2021-05-25: 2 mg/h via INTRAVENOUS
  Administered 2021-05-26: 3 mg/h via INTRAVENOUS
  Administered 2021-05-26 – 2021-05-27 (×2): 2.5 mg/h via INTRAVENOUS
  Administered 2021-05-28 – 2021-05-30 (×5): 4 mg/h via INTRAVENOUS
  Administered 2021-05-31: 2.5 mg/h via INTRAVENOUS
  Administered 2021-06-01: 3 mg/h via INTRAVENOUS
  Administered 2021-06-05 – 2021-06-09 (×3): 1 mg/h via INTRAVENOUS
  Administered 2021-06-14: 0.25 mg/h via INTRAVENOUS
  Filled 2021-05-20 (×23): qty 5

## 2021-05-20 MED ORDER — VALPROIC ACID 250 MG/5ML PO SOLN
250.0000 mg | Freq: Two times a day (BID) | ORAL | Status: DC
  Administered 2021-05-20: 250 mg
  Filled 2021-05-20: qty 5

## 2021-05-20 MED ORDER — SODIUM ZIRCONIUM CYCLOSILICATE 5 G PO PACK
5.0000 g | PACK | Freq: Once | ORAL | Status: AC
  Administered 2021-05-20: 5 g
  Filled 2021-05-20: qty 1

## 2021-05-20 MED ORDER — HYDROMORPHONE HCL 2 MG/ML IJ SOLN
4.0000 mg | Freq: Once | INTRAMUSCULAR | Status: AC
  Administered 2021-05-20: 4 mg via INTRAVENOUS
  Filled 2021-05-20: qty 2

## 2021-05-20 MED ORDER — VALPROIC ACID 250 MG/5ML PO SOLN
500.0000 mg | Freq: Two times a day (BID) | ORAL | Status: DC
  Administered 2021-05-20 – 2021-06-15 (×51): 500 mg
  Filled 2021-05-20 (×51): qty 10

## 2021-05-20 MED ORDER — NOREPINEPHRINE 4 MG/250ML-% IV SOLN
INTRAVENOUS | Status: AC
Start: 2021-05-20 — End: 2021-05-20
  Administered 2021-05-20: 2 ug/min via INTRAVENOUS
  Filled 2021-05-20: qty 250

## 2021-05-20 MED ORDER — PROPOFOL 1000 MG/100ML IV EMUL
0.0000 ug/kg/min | INTRAVENOUS | Status: DC
  Administered 2021-05-20: 20 ug/kg/min via INTRAVENOUS
  Filled 2021-05-20: qty 100

## 2021-05-20 MED ORDER — HYDROMORPHONE BOLUS VIA INFUSION
0.2500 mg | INTRAVENOUS | Status: DC | PRN
  Administered 2021-05-20: 2 mg via INTRAVENOUS
  Administered 2021-05-20: 1 mg via INTRAVENOUS
  Administered 2021-05-21 – 2021-05-26 (×3): 2 mg via INTRAVENOUS
  Administered 2021-05-28: 1 mg via INTRAVENOUS
  Administered 2021-05-29 – 2021-06-15 (×2): 2 mg via INTRAVENOUS

## 2021-05-20 MED ORDER — SODIUM CHLORIDE 0.9 % IV SOLN
2.0000 g | Freq: Three times a day (TID) | INTRAVENOUS | Status: DC
  Administered 2021-05-20 – 2021-05-24 (×13): 2 g via INTRAVENOUS
  Filled 2021-05-20 (×14): qty 12.5

## 2021-05-20 MED ORDER — DIAZEPAM 5 MG PO TABS
10.0000 mg | ORAL_TABLET | Freq: Four times a day (QID) | ORAL | Status: DC
  Administered 2021-05-20 – 2021-05-25 (×18): 10 mg
  Filled 2021-05-20 (×18): qty 2

## 2021-05-20 MED ORDER — VALPROIC ACID 250 MG/5ML PO SOLN
250.0000 mg | Freq: Once | ORAL | Status: AC
  Administered 2021-05-20: 250 mg
  Filled 2021-05-20: qty 5

## 2021-05-20 MED ORDER — NOREPINEPHRINE 4 MG/250ML-% IV SOLN
0.0000 ug/min | INTRAVENOUS | Status: DC
  Administered 2021-05-21: 35 ug/min via INTRAVENOUS
  Administered 2021-05-21: 20 ug/min via INTRAVENOUS
  Administered 2021-05-21: 7 ug/min via INTRAVENOUS
  Administered 2021-05-21: 48 ug/min via INTRAVENOUS
  Administered 2021-05-22: 3 ug/min via INTRAVENOUS
  Filled 2021-05-20 (×5): qty 250

## 2021-05-20 NOTE — Progress Notes (Signed)
eLink Physician-Brief Progress Note Patient Name: Nathan West DOB: Nov 02, 1990 MRN: [id number]   Date of Service  05/20/2021  HPI/Events of Note  ABG on 100%/PRVC 35/TV 490/P 10 = 7.32/77.3/73/39.6. ABG meets or exceeds goals of permissive hypercapnia (goal PCO2 <85 and pH > 7.15-7.2) as stated in Dr. Ophelia Charter note.   eICU Interventions  Plan: Continue present ventilator management.     Intervention Category Major Interventions: Respiratory failure - evaluation and management  Felisa Zechman Eugene 05/20/2021, 4:48 AM

## 2021-05-20 NOTE — Progress Notes (Signed)
Pt's head repositioned without incidence.
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
## 2021-05-20 NOTE — Progress Notes (Signed)
Patient ID: Nathan West, male   DOB: [birth year], 31 y.o.   MRN: [id number] I met with his GF at the bedside. We called his other child's mother as well. I updated them on his progress and how critically ill he is with this PNA. I discussed the treatment plan and answered their questions.  Georganna Skeans, MD, MPH, FACS Please use [email or web address] to contact on call provider
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
## 2021-05-20 NOTE — Progress Notes (Signed)
0920: Following unproning, BIS monitoring stickers changed and pt noted to have BIS score of 73 with SQI of 100. Dr. Carlis Abbott notified. Order for dilaudid drip and 4 mg bolus given.   1025: Dr. Carlis Abbott notified of unchanged BIS score following dilaudid administration. Dr. Carlis Abbott discussing sedation management with pharmacist.   1040: Pharmacist notified RN of change in Versed order to max of 15. Versed increased to 15. RN to notify pharmacy if no change in BIS score within 30 minutes of Versed increase.

## 2021-05-20 NOTE — Progress Notes (Signed)
Patients head repositioned to the right with no issues. 

## 2021-05-20 NOTE — Progress Notes (Signed)
Pharmacy Antibiotic Note  Nathan West is a 31 y.o. male admitted on 05-13-2021 with pneumonia.  Pharmacy has been consulted for Linezolid/Cefepime dosing. Renal function stable MRSA cavitary PNA with new onset abundant GNR   Plan: Linezolid 600mg  BID Cefepime2 gm q8hr Will monitor for acute changes in renal function and adjust as needed F/u cultures results and de-escalate as appropriate   Height: 5\' 9"  (175.3 cm) Weight: 83.5 kg (184 lb 1.4 oz) IBW/kg (Calculated) : 70.7  Temp (24hrs), Avg:99.2 F (37.3 C), Min:95 F (35 C), Max:100.9 F (38.3 C)  Recent Labs  Lab 05/15/21 0501 05/16/21 0609 05/16/21 1829 05/17/21 0450 05/19/21 0521 05/20/21 0532  WBC 23.7* 28.1*  --  27.7* 26.6* 25.3*  CREATININE 1.11 1.21 1.06 1.00 0.86 0.96    Estimated Creatinine Clearance: 110.5 mL/min (by C-G formula based on SCr of 0.96 mg/dL).    No Known Allergies  Thank you for allowing pharmacy to be a part of this patient's care.  05/21/21, PharmD Clinical Pharmacist  Please check AMION for all [facility] Pharmacy numbers After 10:00 PM, call Main Pharmacy [phone]
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
## 2021-05-20 NOTE — Progress Notes (Signed)
Pt's ETT holder was removed, mepilex pads were applied to pts cheeks and neck. Pt has abrasion to the right upper cheek, and along back of neck. ETT secured with tape. Pt then proned, RT x2, RN x4 at bedside. No complications noted during proning. Suction catheter able to pass.

## 2021-05-20 NOTE — Progress Notes (Signed)
Dr. Chestine Spore notified of skin breakdown on neck prior to proning at 1700. RN advised to apply mepilex foams and barrier cream to wounds. See photos below for images of wounds.    Right neck   Left neck

## 2021-05-20 NOTE — Progress Notes (Signed)
Patients head repositioned to the left with no issues.  

## 2021-05-20 NOTE — Progress Notes (Signed)
1120: Pharmacist and Dr. Chestine Spore notified of no change in BIS score following Versed increase. Propofol orders given  1200: Dr. Chestine Spore notified of no change in BIS score on 20 of propofol and new hypotension. Order given for norepinephrine infusion and increase of Versed ceiling to 20.

## 2021-05-21 ENCOUNTER — Inpatient Hospital Stay (HOSPITAL_COMMUNITY): Payer: Medicaid Other

## 2021-05-21 LAB — POCT I-STAT 7, (LYTES, BLD GAS, ICA,H+H)
Acid-Base Excess: 10 mmol/L — ABNORMAL HIGH (ref 0.0–2.0)
Acid-Base Excess: 10 mmol/L — ABNORMAL HIGH (ref 0.0–2.0)
Acid-Base Excess: 10 mmol/L — ABNORMAL HIGH (ref 0.0–2.0)
Bicarbonate: 38.6 mmol/L — ABNORMAL HIGH (ref 20.0–28.0)
Bicarbonate: 39.5 mmol/L — ABNORMAL HIGH (ref 20.0–28.0)
Bicarbonate: 39.2 mmol/L — ABNORMAL HIGH (ref 20.0–28.0)
Calcium, Ion: 1.13 mmol/L — ABNORMAL LOW (ref 1.15–1.40)
Calcium, Ion: 1.13 mmol/L — ABNORMAL LOW (ref 1.15–1.40)
Calcium, Ion: 1.15 mmol/L (ref 1.15–1.40)
HCT: 22 % — ABNORMAL LOW (ref 39.0–52.0)
HCT: 24 % — ABNORMAL LOW (ref 39.0–52.0)
HCT: 26 % — ABNORMAL LOW (ref 39.0–52.0)
Hemoglobin: 7.5 g/dL — ABNORMAL LOW (ref 13.0–17.0)
Hemoglobin: 8.2 g/dL — ABNORMAL LOW (ref 13.0–17.0)
Hemoglobin: 8.8 g/dL — ABNORMAL LOW (ref 13.0–17.0)
O2 Saturation: 56 %
O2 Saturation: 71 %
O2 Saturation: 64 %
Potassium: 5.6 mmol/L — ABNORMAL HIGH (ref 3.5–5.1)
Potassium: 5.6 mmol/L — ABNORMAL HIGH (ref 3.5–5.1)
Potassium: 5.6 mmol/L — ABNORMAL HIGH (ref 3.5–5.1)
Sodium: 148 mmol/L — ABNORMAL HIGH (ref 135–145)
Sodium: 146 mmol/L — ABNORMAL HIGH (ref 135–145)
Sodium: 145 mmol/L (ref 135–145)
TCO2: 41 mmol/L — ABNORMAL HIGH (ref 22–32)
TCO2: 42 mmol/L — ABNORMAL HIGH (ref 22–32)
TCO2: 42 mmol/L — ABNORMAL HIGH (ref 22–32)
pCO2 arterial: 88.9 mmHg (ref 32–48)
pCO2 arterial: 90.6 mmHg (ref 32–48)
pCO2 arterial: 92.4 mmHg (ref 32–48)
pH, Arterial: 7.245 — ABNORMAL LOW (ref 7.35–7.45)
pH, Arterial: 7.248 — ABNORMAL LOW (ref 7.35–7.45)
pH, Arterial: 7.236 — ABNORMAL LOW (ref 7.35–7.45)
pO2, Arterial: 36 mmHg — CL (ref 83–108)
pO2, Arterial: 46 mmHg — ABNORMAL LOW (ref 83–108)
pO2, Arterial: 42 mmHg — ABNORMAL LOW (ref 83–108)

## 2021-05-21 LAB — BASIC METABOLIC PANEL
Anion gap: 6 (ref 5–15)
Anion gap: 6 (ref 5–15)
BUN: 61 mg/dL — ABNORMAL HIGH (ref 6–20)
BUN: 63 mg/dL — ABNORMAL HIGH (ref 6–20)
CO2: 37 mmol/L — ABNORMAL HIGH (ref 22–32)
CO2: 36 mmol/L — ABNORMAL HIGH (ref 22–32)
Calcium: 8.2 mg/dL — ABNORMAL LOW (ref 8.9–10.3)
Calcium: 7.8 mg/dL — ABNORMAL LOW (ref 8.9–10.3)
Chloride: 107 mmol/L (ref 98–111)
Chloride: 104 mmol/L (ref 98–111)
Creatinine, Ser: 1.33 mg/dL — ABNORMAL HIGH (ref 0.61–1.24)
Creatinine, Ser: 1.35 mg/dL — ABNORMAL HIGH (ref 0.61–1.24)
GFR, Estimated: >60 mL/min (ref >60)
GFR, Estimated: >60 mL/min (ref >60)
Glucose, Bld: 144 mg/dL — ABNORMAL HIGH (ref 70–99)
Glucose, Bld: 179 mg/dL — ABNORMAL HIGH (ref 70–99)
Potassium: 5.7 mmol/L — ABNORMAL HIGH (ref 3.5–5.1)
Potassium: 5.7 mmol/L — ABNORMAL HIGH (ref 3.5–5.1)
Sodium: 150 mmol/L — ABNORMAL HIGH (ref 135–145)
Sodium: 146 mmol/L — ABNORMAL HIGH (ref 135–145)

## 2021-05-21 LAB — CBC
HCT: 23.2 % — ABNORMAL LOW (ref 39.0–52.0)
Hemoglobin: 6.4 g/dL — CL (ref 13.0–17.0)
MCH: 28.4 pg (ref 26.0–34.0)
MCHC: 27.6 g/dL — ABNORMAL LOW (ref 30.0–36.0)
MCV: 103.1 fL — ABNORMAL HIGH (ref 80.0–100.0)
Platelets: 501 10*3/uL — ABNORMAL HIGH (ref 150–400)
RBC: 2.25 MIL/uL — ABNORMAL LOW (ref 4.22–5.81)
RDW: 17.2 % — ABNORMAL HIGH (ref 11.5–15.5)
WBC: 20.8 10*3/uL — ABNORMAL HIGH (ref 4.0–10.5)
nRBC: 2.6 % — ABNORMAL HIGH (ref 0.0–0.2)

## 2021-05-21 LAB — HEMOGLOBIN AND HEMATOCRIT, BLOOD
HCT: 25.3 % — ABNORMAL LOW (ref 39.0–52.0)
HCT: 26.8 % — ABNORMAL LOW (ref 39.0–52.0)
Hemoglobin: 6.8 g/dL — CL (ref 13.0–17.0)
Hemoglobin: 7.8 g/dL — ABNORMAL LOW (ref 13.0–17.0)

## 2021-05-21 LAB — DIC (DISSEMINATED INTRAVASCULAR COAGULATION)PANEL
D-Dimer, Quant: 8.25 ug/mL-FEU — ABNORMAL HIGH (ref 0.00–0.50)
Fibrinogen: >800 mg/dL — ABNORMAL HIGH (ref 210–475)
INR: 1.3 — ABNORMAL HIGH (ref 0.8–1.2)
Platelets: 486 10*3/uL — ABNORMAL HIGH (ref 150–400)
Prothrombin Time: 15.6 seconds — ABNORMAL HIGH (ref 11.4–15.2)
Smear Review: NONE SEEN
aPTT: 28 seconds (ref 24–36)

## 2021-05-21 LAB — GLUCOSE, CAPILLARY
Glucose-Capillary: 141 mg/dL — ABNORMAL HIGH (ref 70–99)
Glucose-Capillary: 143 mg/dL — ABNORMAL HIGH (ref 70–99)
Glucose-Capillary: 152 mg/dL — ABNORMAL HIGH (ref 70–99)
Glucose-Capillary: 167 mg/dL — ABNORMAL HIGH (ref 70–99)
Glucose-Capillary: 170 mg/dL — ABNORMAL HIGH (ref 70–99)
Glucose-Capillary: 175 mg/dL — ABNORMAL HIGH (ref 70–99)

## 2021-05-21 LAB — PREPARE RBC (CROSSMATCH)

## 2021-05-21 LAB — TRIGLYCERIDES: Triglycerides: 180 mg/dL — ABNORMAL HIGH (ref <150)

## 2021-05-21 MED ORDER — VASOPRESSIN 20 UNITS/100 ML INFUSION FOR SHOCK
0.0000 [IU]/min | INTRAVENOUS | Status: DC
  Administered 2021-05-21 – 2021-05-22 (×3): 0.03 [IU]/min via INTRAVENOUS
  Filled 2021-05-21 (×3): qty 100

## 2021-05-21 MED ORDER — SODIUM CHLORIDE 0.9 % IV SOLN
150.0000 mg | INTRAVENOUS | Status: DC
  Administered 2021-05-21 – 2021-05-23 (×3): 150 mg via INTRAVENOUS
  Filled 2021-05-21: qty 5
  Filled 2021-05-21 (×2): qty 7.5

## 2021-05-21 MED ORDER — VASOPRESSIN 20 UNITS/100 ML INFUSION FOR SHOCK
INTRAVENOUS | Status: AC
  Filled 2021-05-21: qty 100

## 2021-05-21 MED ORDER — FREE WATER
200.0000 mL | Status: DC
  Administered 2021-05-21 – 2021-05-25 (×19): 200 mL

## 2021-05-21 MED ORDER — SODIUM CHLORIDE 0.9% IV SOLUTION: Freq: Once | INTRAVENOUS | Status: DC

## 2021-05-21 MED ORDER — GABAPENTIN 250 MG/5ML PO SOLN
300.0000 mg | Freq: Two times a day (BID) | ORAL | Status: DC
  Administered 2021-05-21 – 2021-06-09 (×39): 300 mg
  Filled 2021-05-21 (×41): qty 6

## 2021-05-21 MED ORDER — SODIUM ZIRCONIUM CYCLOSILICATE 10 G PO PACK: 10.0000 g | PACK | Freq: Once | ORAL | Status: DC

## 2021-05-21 MED ORDER — SODIUM CHLORIDE 0.9% IV SOLUTION: Freq: Once | INTRAVENOUS | Status: AC

## 2021-05-21 MED ORDER — SODIUM ZIRCONIUM CYCLOSILICATE 10 G PO PACK
10.0000 g | PACK | Freq: Once | ORAL | Status: AC
  Administered 2021-05-21: 10 g
  Filled 2021-05-21: qty 1

## 2021-05-21 MED ORDER — ENOXAPARIN SODIUM 80 MG/0.8ML IJ SOSY
80.0000 mg | PREFILLED_SYRINGE | Freq: Two times a day (BID) | INTRAMUSCULAR | Status: DC
  Administered 2021-05-21 – 2021-06-06 (×33): 80 mg via SUBCUTANEOUS
  Filled 2021-05-21 (×35): qty 0.8

## 2021-05-21 NOTE — Progress Notes (Signed)
Pt's head repositioned to the right with no issues.
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
## 2021-05-21 NOTE — Progress Notes (Signed)
Follow up ABG after chest tube clamping 7.2/90/46/35 despite saturations in high 80s. Previous PO2 before clamping chest tube 36.  Con't prone positioning, current vent settings with PEEP 12, FiO2 100%. Chest tube remains clamped unless acute decompensation.  Julian Hy, DO 05/21/21 12:58 PM [facility]

## 2021-05-21 NOTE — Progress Notes (Signed)
eLink Physician-Brief Progress Note Patient Name: Nathan West DOB: Jul 15, 1990 MRN: [id number]   Date of Service  05/21/2021  HPI/Events of Note  Multiple issues: 1. K+ = 5.7 and 2. Anemia -  Hgb = 6.4.  eICU Interventions  Plan: Lokelma 10 gm per tube X 1. Transfuse 1 unit PRBC, Repeat BMP and H/H at 2 PM.     Intervention Category Major Interventions: Electrolyte abnormality - evaluation and management  Heather Mckendree Eugene 05/21/2021, 7:00 AM
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
## 2021-05-21 NOTE — Progress Notes (Signed)
Patients head repositioned to the left with no issues.  

## 2021-05-21 NOTE — Progress Notes (Signed)
Patients head repositioned to the left with no issues. ETT reinforced with additional cloth tape as old taped was thin and rolled together from oral secretions.
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
## 2021-05-21 NOTE — IPAL (Signed)
  Interdisciplinary Goals of Care Family Meeting   Date carried out:: 05/21/2021  Location of the meeting: Phone conference  Member's involved: Physician, Bedside Registered Nurse, and Family Member or next of kin  Durable Power of Attorney or acting medical decision maker: mother    Discussion: We discussed goals of care for Nathan West .  I spoke to his mother via phone and she also had me speak with [name]'s god-mother, who helped interpret what we were saying. He is doing worse today despite aggressive antibiotics, ventilation, prone positioning. I am worried that his hypoxia is causing worse shock and he is at risk of cardiac arrest. If we are unable to reverse his hypoxia I do not think CPR will be of benefit and I recommend changing code status to DNR. We will continue all aggressive care measures. She agreed. I recommended she come visit today due to his decline. RN aware.   Code status: Full DNR  Disposition: Continue current acute care   Time spent for the meeting: 10 min.  Steffanie Dunn 05/21/2021, 11:26 AM

## 2021-05-21 NOTE — Progress Notes (Addendum)
Patient noted twice with moderate amount of yellowish oral drainage on pillowcase during repositioning, not seen earlier in shift. ?emesis. Cortrak and bridle in place. Cortrak documentation notes marking at nare was 72cm upon placement, now noted at 62cm. Cortrak temporarily adjusted during bronch earlier this AM per MD request because of tube visualized in trachea (determined to be esophageal probe). Tube feeds stopped. CCM made aware. Hold tube feeds and enteral medications. KUB ordered.

## 2021-05-21 NOTE — Progress Notes (Signed)
Pt's head and arms repositioned by RT and RN X2 with no complications.

## 2021-05-21 NOTE — Progress Notes (Signed)
Date and time results received: 05/21/21    Test: Hgb Critical Value: 6.8  Name of Provider Notified: Dr. Cliffton Asters  Orders Received? Or Actions Taken?:  Additional 1 unit PRBC ordered.

## 2021-05-21 NOTE — Progress Notes (Signed)
Patients head repositioned to the right with no issues. 
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
## 2021-05-21 NOTE — Progress Notes (Signed)
Pt's head and arms repositioned at this time by RT and RN X2 with no complications.
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
## 2021-05-21 NOTE — Progress Notes (Signed)
Patients head repositioned to the right with no issues.

## 2021-05-21 NOTE — Progress Notes (Signed)
Pt was placed from prone to supine at 9:15 with a sat of 90. Pt then had saturations staying in the 70's. Dr. Chestine Spore was called. Pt was then bronched and found the esophageal temp probe in the trachea. The esophageal probe was removed in the procedure.   CXR & ABG done. Po2 was 36, MD aware. Pt was re-proned emergently.    Pt's ETT holder was removed, mepilex pads were applied to the pts cheeks. ETT secured with tape. Pt then proned, RT x3, RN x4 at bedside. No complications noted during proning. Suction catheter able to pass.

## 2021-05-21 NOTE — Progress Notes (Signed)
Patient's head and arms repositioned at this time by RN and RT with no complications.

## 2021-05-21 NOTE — Progress Notes (Addendum)
Called to bedside for desaturation without recovery after supination. Saturations remaining in 34s. Intermittent air leak from chest tube, bilateral breath sounds. Able to pass ballard through ETT, Peak pressures the same, capturing Vt similarly to earlier exam this morning.  STAT CXR Bronch to evaluate for mucus plug-- see separate note. No significant bronchial plugs seen to explain hypoxia. Esophageal temp probe found in trachea and removed during procedure. If CXR does not explain an obvious cause for hypoxia, will plan to re-prone emergently this morning.  Julian Hy, DO 05/21/21 10:02 AM [facility]   CXR> no pneumothorax, worse left-sided disease. [place] of left chest-- sliding lung throughout laterally and posteriorly  Purulent plugs in chest tube, larger air leak and large loss of tidal volumes from vent-- now about 100-150cc. Saturations post-proning in low 80s, not significantly changed with wedging of left lung down.   Repeat ABG in 1 hr.  Will make a decision about additional imaging at that time. Will discuss with Dr. Dema Severin.  Additional cc time: 45 min.  Julian Hy, DO 05/21/21 10:48 AM [facility]  Now on 2 pressors. Persistent hypoxia. Discussed with surgery. He hs been on full AC and high risk for bleeding- no plans for empiric TPA, low risk for PE overall and not stable enough to travel for CT.   I reviewed Mr. Landsman case with Dr. Dema Severin and Dr. Roxan Hockey at bedside. Trial of clamping chest tube to limit leak from chest tube to improve MV. Observed for about 15 min without significant hemodynamic changes after the tube was clamped. Reviewed with his nurse to immediately unclamp the tube if he decompensates.   I called his mother to discuss his ongoing care and my concern that he is very high risk for additional decompensation  Additional cc time: 30 min.  Julian Hy, DO 05/21/21 11:34 AM [facility]

## 2021-05-22 ENCOUNTER — Inpatient Hospital Stay (HOSPITAL_COMMUNITY): Payer: Medicaid Other

## 2021-05-22 LAB — BPAM RBC
Blood Product Expiration Date: 202306142359
Blood Product Expiration Date: 202306192359
ISSUE DATE / TIME: 202305201126
ISSUE DATE / TIME: 202305201951
Unit Type and Rh: 5100
Unit Type and Rh: 5100

## 2021-05-22 LAB — COMPREHENSIVE METABOLIC PANEL
ALT: 58 U/L — ABNORMAL HIGH (ref 0–44)
AST: 88 U/L — ABNORMAL HIGH (ref 15–41)
Albumin: <1.5 g/dL — ABNORMAL LOW (ref 3.5–5.0)
Alkaline Phosphatase: 142 U/L — ABNORMAL HIGH (ref 38–126)
Anion gap: 4 — ABNORMAL LOW (ref 5–15)
BUN: 62 mg/dL — ABNORMAL HIGH (ref 6–20)
CO2: 39 mmol/L — ABNORMAL HIGH (ref 22–32)
Calcium: 8.2 mg/dL — ABNORMAL LOW (ref 8.9–10.3)
Chloride: 106 mmol/L (ref 98–111)
Creatinine, Ser: 1.36 mg/dL — ABNORMAL HIGH (ref 0.61–1.24)
GFR, Estimated: >60 mL/min (ref >60)
Glucose, Bld: 152 mg/dL — ABNORMAL HIGH (ref 70–99)
Potassium: 5.7 mmol/L — ABNORMAL HIGH (ref 3.5–5.1)
Sodium: 149 mmol/L — ABNORMAL HIGH (ref 135–145)
Total Bilirubin: 0.4 mg/dL (ref 0.3–1.2)
Total Protein: 7.6 g/dL (ref 6.5–8.1)

## 2021-05-22 LAB — CBC
HCT: 25.9 % — ABNORMAL LOW (ref 39.0–52.0)
Hemoglobin: 7.6 g/dL — ABNORMAL LOW (ref 13.0–17.0)
MCH: 29.1 pg (ref 26.0–34.0)
MCHC: 29.3 g/dL — ABNORMAL LOW (ref 30.0–36.0)
MCV: 99.2 fL (ref 80.0–100.0)
Platelets: 412 10*3/uL — ABNORMAL HIGH (ref 150–400)
RBC: 2.61 MIL/uL — ABNORMAL LOW (ref 4.22–5.81)
RDW: 18.3 % — ABNORMAL HIGH (ref 11.5–15.5)
WBC: 21.7 10*3/uL — ABNORMAL HIGH (ref 4.0–10.5)
nRBC: 2.7 % — ABNORMAL HIGH (ref 0.0–0.2)

## 2021-05-22 LAB — CULTURE, RESPIRATORY W GRAM STAIN

## 2021-05-22 LAB — GLUCOSE, CAPILLARY
Glucose-Capillary: 144 mg/dL — ABNORMAL HIGH (ref 70–99)
Glucose-Capillary: 147 mg/dL — ABNORMAL HIGH (ref 70–99)
Glucose-Capillary: 150 mg/dL — ABNORMAL HIGH (ref 70–99)
Glucose-Capillary: 146 mg/dL — ABNORMAL HIGH (ref 70–99)
Glucose-Capillary: 139 mg/dL — ABNORMAL HIGH (ref 70–99)
Glucose-Capillary: 138 mg/dL — ABNORMAL HIGH (ref 70–99)

## 2021-05-22 LAB — TYPE AND SCREEN
ABO/RH(D): O POS
Antibody Screen: NEGATIVE
Unit division: 0
Unit division: 0

## 2021-05-22 LAB — POCT I-STAT 7, (LYTES, BLD GAS, ICA,H+H)
Acid-Base Excess: 11 mmol/L — ABNORMAL HIGH (ref 0.0–2.0)
Acid-Base Excess: 11 mmol/L — ABNORMAL HIGH (ref 0.0–2.0)
Bicarbonate: 40.7 mmol/L — ABNORMAL HIGH (ref 20.0–28.0)
Bicarbonate: 39.9 mmol/L — ABNORMAL HIGH (ref 20.0–28.0)
Calcium, Ion: 1.18 mmol/L (ref 1.15–1.40)
Calcium, Ion: 1.18 mmol/L (ref 1.15–1.40)
HCT: 27 % — ABNORMAL LOW (ref 39.0–52.0)
HCT: 24 % — ABNORMAL LOW (ref 39.0–52.0)
Hemoglobin: 9.2 g/dL — ABNORMAL LOW (ref 13.0–17.0)
Hemoglobin: 8.2 g/dL — ABNORMAL LOW (ref 13.0–17.0)
O2 Saturation: 82 %
O2 Saturation: 93 %
Potassium: 5.5 mmol/L — ABNORMAL HIGH (ref 3.5–5.1)
Potassium: 5.1 mmol/L (ref 3.5–5.1)
Sodium: 149 mmol/L — ABNORMAL HIGH (ref 135–145)
Sodium: 148 mmol/L — ABNORMAL HIGH (ref 135–145)
TCO2: 43 mmol/L — ABNORMAL HIGH (ref 22–32)
TCO2: 43 mmol/L — ABNORMAL HIGH (ref 22–32)
pCO2 arterial: 91.4 mmHg (ref 32–48)
pCO2 arterial: 90.2 mmHg (ref 32–48)
pH, Arterial: 7.257 — ABNORMAL LOW (ref 7.35–7.45)
pH, Arterial: 7.254 — ABNORMAL LOW (ref 7.35–7.45)
pO2, Arterial: 57 mmHg — ABNORMAL LOW (ref 83–108)
pO2, Arterial: 83 mmHg (ref 83–108)

## 2021-05-22 LAB — TRIGLYCERIDES: Triglycerides: 210 mg/dL — ABNORMAL HIGH (ref <150)

## 2021-05-22 MED ORDER — SODIUM ZIRCONIUM CYCLOSILICATE 10 G PO PACK
10.0000 g | PACK | Freq: Once | ORAL | Status: AC
  Administered 2021-05-22: 10 g
  Filled 2021-05-22: qty 1

## 2021-05-22 MED ORDER — ACETAMINOPHEN 650 MG RE SUPP
650.0000 mg | RECTAL | Status: AC | PRN
  Administered 2021-05-22 (×2): 650 mg via RECTAL
  Filled 2021-05-22 (×2): qty 1

## 2021-05-22 MED ORDER — SODIUM ZIRCONIUM CYCLOSILICATE 10 G PO PACK
10.0000 g | PACK | Freq: Two times a day (BID) | ORAL | Status: AC
  Administered 2021-05-22 (×2): 10 g
  Filled 2021-05-22 (×2): qty 1

## 2021-05-22 MED ORDER — PANTOPRAZOLE SODIUM 40 MG IV SOLR
40.0000 mg | Freq: Two times a day (BID) | INTRAVENOUS | Status: DC
  Administered 2021-05-22 – 2021-05-26 (×10): 40 mg via INTRAVENOUS
  Filled 2021-05-22 (×10): qty 10

## 2021-05-22 NOTE — Progress Notes (Signed)
Patients head repositioned to the left with no issues.

## 2021-05-22 NOTE — Progress Notes (Signed)
Holding off CPT for now.

## 2021-05-22 NOTE — Progress Notes (Signed)
Pts head repositioned to the left with no complications with RT and RN x2. Suction catheter able to pass.

## 2021-05-22 NOTE — Progress Notes (Signed)
17 Days Post-Op Procedure(s) (LRB): VIDEO ASSISTED THORACOSCOPY (Right) Subjective: Intubated, sedated  Objective: Vital signs in last 24 hours: Temp:  [97.4 F (36.3 C)-103 F (39.4 C)] 102 F (38.9 C) (05/21 0834) Pulse Rate:  [124-138] 126 (05/21 0900) Cardiac Rhythm: Sinus tachycardia (05/21 0800) Resp:  [0-37] 35 (05/21 0900) BP: (130-157)/(57-82) 133/74 (05/21 0900) SpO2:  [77 %-100 %] 95 % (05/21 0900) Arterial Line BP: (98-142)/(43-74) 120/61 (05/21 0900) FiO2 (%):  [100 %] 100 % (05/21 0855)  Hemodynamic parameters for last 24 hours:    Intake/Output from previous day: 05/20 0701 - 05/21 0700 In: 3939.4 [I.V.:1930.5; Blood:315; NG/GT:900.5; IV Piggyback:793.4] Out: 3880 [Urine:3830; Chest Tube:50] Intake/Output this shift: Total I/O In: 391.4 [I.V.:91.4; IV Piggyback:300] Out: -   Prone on vent  Lab Results: Recent Labs    05/21/21 0600 05/21/21 0812 05/21/21 1008 05/22/21 0803 05/22/21 0851  WBC 20.8*  --   --  21.7*  --   HGB 6.4*  --    < > 7.6* 9.2*  HCT 23.2*  --    < > 25.9* 27.0*  PLT 501* 486*  --  412*  --    < > = values in this interval not displayed.   BMET:  Recent Labs    05/21/21 1628 05/22/21 0803 05/22/21 0851  NA 146* 149* 149*  K 5.7* 5.7* 5.5*  CL 104 106  --   CO2 36* 39*  --   GLUCOSE 179* 152*  --   BUN 63* 62*  --   CREATININE 1.35* 1.36*  --   CALCIUM 7.8* 8.2*  --     PT/INR:  Recent Labs    05/21/21 0812  LABPROT 15.6*  INR 1.3*   ABG    Component Value Date/Time   PHART 7.257 (L) 05/22/2021 0851   HCO3 40.7 (H) 05/22/2021 0851   TCO2 43 (H) 05/22/2021 0851   ACIDBASEDEF 5.0 (H) 05/04/2021 2335   O2SAT 82 05/22/2021 0851   CBG (last 3)  Recent Labs    05/21/21 2333 05/22/21 0311 05/22/21 0758  GLUCAP 141* 144* 147*    Assessment/Plan: S/P Procedure(s) (LRB): VIDEO ASSISTED THORACOSCOPY (Right) - Oxygenation slightly improved, hypercarbia about the same Ventilator mechanics better with tube  clamped I unclamped tube and there was a large air leak with an immediate decrease in exhaled volume, reclamped with return to baseline Continue with trial of clamping tube- unclamp immediately if any hemodynamic deterioration   LOS: 27 days    Melrose Nakayama 05/22/2021

## 2021-05-22 NOTE — Progress Notes (Signed)
Pt head turned to the right at this time. Pt tolerated well.  ETT secured.
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
## 2021-05-22 NOTE — Progress Notes (Signed)
Pts head repositioned to the left with no complications with RT and Rnx2. Suction catheter able to pass.

## 2021-05-22 NOTE — Progress Notes (Signed)
Patients head repositioned to the right with no issues. 
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
## 2021-05-22 NOTE — Progress Notes (Addendum)
Pt's head repositioned to the right with no complications with RT and RN X2. Suction catheter able to pass.

## 2021-05-22 NOTE — Congregational Nurse Program (Signed)
Pts head repositioned to the right with no complications with RT and Rnx2. Suction catheter able to pass.

## 2021-05-22 NOTE — Progress Notes (Signed)
Cortrak in stomach based on KBU. Cortrak team needs to advance it before it can be used. Will continue to hold Per tube meds and tube feed.

## 2021-05-22 NOTE — Progress Notes (Signed)
Pt's head turned  to the left by RT and 2 Rns with no complications. ETT secured and able to pass catheter. Pt tolerated well

## 2021-05-22 NOTE — Progress Notes (Signed)
eLink Physician-Brief Progress Note Patient Name: Nathan West DOB: 19-May-1990 MRN: [id number]   Date of Service  05/22/2021  HPI/Events of Note  PRN Tylenol order needs to be changed to rectal.  eICU Interventions  Order entered.        Thomasene Lot Legna Mausolf 05/22/2021, 2:17 AM

## 2021-05-23 ENCOUNTER — Inpatient Hospital Stay (HOSPITAL_COMMUNITY): Payer: Medicaid Other

## 2021-05-23 LAB — POCT I-STAT 7, (LYTES, BLD GAS, ICA,H+H)
Acid-Base Excess: 12 mmol/L — ABNORMAL HIGH (ref 0.0–2.0)
Acid-Base Excess: 13 mmol/L — ABNORMAL HIGH (ref 0.0–2.0)
Bicarbonate: 40.9 mmol/L — ABNORMAL HIGH (ref 20.0–28.0)
Bicarbonate: 42 mmol/L — ABNORMAL HIGH (ref 20.0–28.0)
Calcium, Ion: 1.21 mmol/L (ref 1.15–1.40)
Calcium, Ion: 1.22 mmol/L (ref 1.15–1.40)
HCT: 25 % — ABNORMAL LOW (ref 39.0–52.0)
HCT: 25 % — ABNORMAL LOW (ref 39.0–52.0)
Hemoglobin: 8.5 g/dL — ABNORMAL LOW (ref 13.0–17.0)
Hemoglobin: 8.5 g/dL — ABNORMAL LOW (ref 13.0–17.0)
O2 Saturation: 98 %
O2 Saturation: 75 %
Patient temperature: 98.1
Patient temperature: 98.2
Potassium: 4.5 mmol/L (ref 3.5–5.1)
Potassium: 4.5 mmol/L (ref 3.5–5.1)
Sodium: 150 mmol/L — ABNORMAL HIGH (ref 135–145)
Sodium: 150 mmol/L — ABNORMAL HIGH (ref 135–145)
TCO2: 44 mmol/L — ABNORMAL HIGH (ref 22–32)
TCO2: 45 mmol/L — ABNORMAL HIGH (ref 22–32)
pCO2 arterial: 91.5 mmHg (ref 32–48)
pCO2 arterial: 85.3 mmHg (ref 32–48)
pH, Arterial: 7.257 — ABNORMAL LOW (ref 7.35–7.45)
pH, Arterial: 7.299 — ABNORMAL LOW (ref 7.35–7.45)
pO2, Arterial: 122 mmHg — ABNORMAL HIGH (ref 83–108)
pO2, Arterial: 47 mmHg — ABNORMAL LOW (ref 83–108)

## 2021-05-23 LAB — CBC WITH DIFFERENTIAL/PLATELET
Abs Immature Granulocytes: 1.17 10*3/uL — ABNORMAL HIGH (ref 0.00–0.07)
Basophils Absolute: 0.1 10*3/uL (ref 0.0–0.1)
Basophils Relative: 0 %
Eosinophils Absolute: 0.2 10*3/uL (ref 0.0–0.5)
Eosinophils Relative: 1 %
HCT: 26.4 % — ABNORMAL LOW (ref 39.0–52.0)
Hemoglobin: 7.4 g/dL — ABNORMAL LOW (ref 13.0–17.0)
Immature Granulocytes: 6 %
Lymphocytes Relative: 8 %
Lymphs Abs: 1.7 10*3/uL (ref 0.7–4.0)
MCH: 28.9 pg (ref 26.0–34.0)
MCHC: 28 g/dL — ABNORMAL LOW (ref 30.0–36.0)
MCV: 103.1 fL — ABNORMAL HIGH (ref 80.0–100.0)
Monocytes Absolute: 1 10*3/uL (ref 0.1–1.0)
Monocytes Relative: 5 %
Neutro Abs: 15.7 10*3/uL — ABNORMAL HIGH (ref 1.7–7.7)
Neutrophils Relative %: 80 %
Platelets: 326 10*3/uL (ref 150–400)
RBC: 2.56 MIL/uL — ABNORMAL LOW (ref 4.22–5.81)
RDW: 17.5 % — ABNORMAL HIGH (ref 11.5–15.5)
WBC: 19.7 10*3/uL — ABNORMAL HIGH (ref 4.0–10.5)
nRBC: 2.9 % — ABNORMAL HIGH (ref 0.0–0.2)

## 2021-05-23 LAB — BASIC METABOLIC PANEL
Anion gap: 5 (ref 5–15)
Anion gap: 6 (ref 5–15)
BUN: 60 mg/dL — ABNORMAL HIGH (ref 6–20)
BUN: 53 mg/dL — ABNORMAL HIGH (ref 6–20)
CO2: 37 mmol/L — ABNORMAL HIGH (ref 22–32)
CO2: 38 mmol/L — ABNORMAL HIGH (ref 22–32)
Calcium: 8.4 mg/dL — ABNORMAL LOW (ref 8.9–10.3)
Calcium: 8.3 mg/dL — ABNORMAL LOW (ref 8.9–10.3)
Chloride: 106 mmol/L (ref 98–111)
Chloride: 105 mmol/L (ref 98–111)
Creatinine, Ser: 1.12 mg/dL (ref 0.61–1.24)
Creatinine, Ser: 1.06 mg/dL (ref 0.61–1.24)
GFR, Estimated: >60 mL/min (ref >60)
GFR, Estimated: >60 mL/min (ref >60)
Glucose, Bld: 166 mg/dL — ABNORMAL HIGH (ref 70–99)
Glucose, Bld: 155 mg/dL — ABNORMAL HIGH (ref 70–99)
Potassium: 4.7 mmol/L (ref 3.5–5.1)
Potassium: 4.9 mmol/L (ref 3.5–5.1)
Sodium: 148 mmol/L — ABNORMAL HIGH (ref 135–145)
Sodium: 149 mmol/L — ABNORMAL HIGH (ref 135–145)

## 2021-05-23 LAB — HEPATIC FUNCTION PANEL
ALT: 58 U/L — ABNORMAL HIGH (ref 0–44)
AST: 83 U/L — ABNORMAL HIGH (ref 15–41)
Albumin: <1.5 g/dL — ABNORMAL LOW (ref 3.5–5.0)
Alkaline Phosphatase: 124 U/L (ref 38–126)
Bilirubin, Direct: 0.1 mg/dL (ref 0.0–0.2)
Indirect Bilirubin: 0.5 mg/dL (ref 0.3–0.9)
Total Bilirubin: 0.6 mg/dL (ref 0.3–1.2)
Total Protein: 7.4 g/dL (ref 6.5–8.1)

## 2021-05-23 LAB — GLUCOSE, CAPILLARY
Glucose-Capillary: 154 mg/dL — ABNORMAL HIGH (ref 70–99)
Glucose-Capillary: 155 mg/dL — ABNORMAL HIGH (ref 70–99)
Glucose-Capillary: 150 mg/dL — ABNORMAL HIGH (ref 70–99)
Glucose-Capillary: 143 mg/dL — ABNORMAL HIGH (ref 70–99)
Glucose-Capillary: 155 mg/dL — ABNORMAL HIGH (ref 70–99)
Glucose-Capillary: 139 mg/dL — ABNORMAL HIGH (ref 70–99)

## 2021-05-23 LAB — VALPROIC ACID LEVEL: Valproic Acid Lvl: <10 ug/mL — ABNORMAL LOW (ref 50.0–100.0)

## 2021-05-23 LAB — CULTURE, RESPIRATORY W GRAM STAIN

## 2021-05-23 LAB — TRIGLYCERIDES: Triglycerides: 335 mg/dL — ABNORMAL HIGH (ref <150)

## 2021-05-23 MED ORDER — ACETAMINOPHEN 325 MG PO TABS
650.0000 mg | ORAL_TABLET | Freq: Four times a day (QID) | ORAL | Status: DC | PRN
Start: 2021-05-23 — End: 2021-05-25
  Administered 2021-05-24 – 2021-05-25 (×5): 650 mg
  Filled 2021-05-23 (×5): qty 2

## 2021-05-23 MED ORDER — METOPROLOL TARTRATE 50 MG PO TABS
50.0000 mg | ORAL_TABLET | Freq: Two times a day (BID) | ORAL | Status: DC
  Administered 2021-05-23 – 2021-06-01 (×19): 50 mg
  Filled 2021-05-23 (×20): qty 1

## 2021-05-23 MED ORDER — SODIUM CHLORIDE 0.9 % IV SOLN
100.0000 mg | INTRAVENOUS | Status: DC
  Administered 2021-05-24: 100 mg via INTRAVENOUS
  Filled 2021-05-23: qty 5

## 2021-05-23 MED ORDER — FUROSEMIDE 10 MG/ML IJ SOLN
60.0000 mg | Freq: Once | INTRAMUSCULAR | Status: AC
  Administered 2021-05-23: 60 mg via INTRAVENOUS
  Filled 2021-05-23: qty 6

## 2021-05-23 MED ORDER — FUROSEMIDE 10 MG/ML IJ SOLN
4.0000 mg/h | INTRAVENOUS | Status: DC
  Administered 2021-05-23: 4 mg/h via INTRAVENOUS
  Administered 2021-05-24: 6 mg/h via INTRAVENOUS
  Administered 2021-05-26 – 2021-05-30 (×5): 8 mg/h via INTRAVENOUS
  Filled 2021-05-23 (×7): qty 20

## 2021-05-23 NOTE — Progress Notes (Signed)
Pt's head repositioned to the right without issues.

## 2021-05-23 NOTE — Progress Notes (Signed)
RT note-Patients head turned to left
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
## 2021-05-23 NOTE — Progress Notes (Signed)
Pt placed in supine position.

## 2021-05-23 NOTE — Progress Notes (Signed)
Pt's head repositioned to the left without issues.

## 2021-05-23 NOTE — Progress Notes (Signed)
Pt's head turned to the left, pt tolerated well. ETT secured.

## 2021-05-23 NOTE — Progress Notes (Signed)
Pt placed in prone position

## 2021-05-24 DIAGNOSIS — T07XXXA Unspecified multiple injuries, initial encounter: Secondary | ICD-10-CM

## 2021-05-24 DIAGNOSIS — J80 Acute respiratory distress syndrome: Secondary | ICD-10-CM

## 2021-05-24 DIAGNOSIS — Z515 Encounter for palliative care: Secondary | ICD-10-CM

## 2021-05-24 DIAGNOSIS — W3400XD Accidental discharge from unspecified firearms or gun, subsequent encounter: Secondary | ICD-10-CM

## 2021-05-24 DIAGNOSIS — Z7189 Other specified counseling: Secondary | ICD-10-CM

## 2021-05-24 LAB — POCT I-STAT 7, (LYTES, BLD GAS, ICA,H+H)
Acid-Base Excess: 16 mmol/L — ABNORMAL HIGH (ref 0.0–2.0)
Acid-Base Excess: 18 mmol/L — ABNORMAL HIGH (ref 0.0–2.0)
Acid-Base Excess: 20 mmol/L — ABNORMAL HIGH (ref 0.0–2.0)
Acid-Base Excess: 22 mmol/L — ABNORMAL HIGH (ref 0.0–2.0)
Bicarbonate: 46.6 mmol/L — ABNORMAL HIGH (ref 20.0–28.0)
Bicarbonate: 46.6 mmol/L — ABNORMAL HIGH (ref 20.0–28.0)
Bicarbonate: 49.6 mmol/L — ABNORMAL HIGH (ref 20.0–28.0)
Bicarbonate: 50.8 mmol/L — ABNORMAL HIGH (ref 20.0–28.0)
Calcium, Ion: 1.2 mmol/L (ref 1.15–1.40)
Calcium, Ion: 1.22 mmol/L (ref 1.15–1.40)
Calcium, Ion: 1.23 mmol/L (ref 1.15–1.40)
Calcium, Ion: 1.21 mmol/L (ref 1.15–1.40)
HCT: 34 % — ABNORMAL LOW (ref 39.0–52.0)
HCT: 24 % — ABNORMAL LOW (ref 39.0–52.0)
HCT: 26 % — ABNORMAL LOW (ref 39.0–52.0)
HCT: 26 % — ABNORMAL LOW (ref 39.0–52.0)
Hemoglobin: 11.6 g/dL — ABNORMAL LOW (ref 13.0–17.0)
Hemoglobin: 8.2 g/dL — ABNORMAL LOW (ref 13.0–17.0)
Hemoglobin: 8.8 g/dL — ABNORMAL LOW (ref 13.0–17.0)
Hemoglobin: 8.8 g/dL — ABNORMAL LOW (ref 13.0–17.0)
O2 Saturation: 98 %
O2 Saturation: 86 %
O2 Saturation: 96 %
O2 Saturation: 98 %
Patient temperature: 101
Patient temperature: 101.4
Patient temperature: 100.6
Patient temperature: 101
Potassium: 4.2 mmol/L (ref 3.5–5.1)
Potassium: 4.1 mmol/L (ref 3.5–5.1)
Potassium: 3.8 mmol/L (ref 3.5–5.1)
Potassium: 3.8 mmol/L (ref 3.5–5.1)
Sodium: 152 mmol/L — ABNORMAL HIGH (ref 135–145)
Sodium: 154 mmol/L — ABNORMAL HIGH (ref 135–145)
Sodium: 154 mmol/L — ABNORMAL HIGH (ref 135–145)
Sodium: 156 mmol/L — ABNORMAL HIGH (ref 135–145)
TCO2: 50 mmol/L — ABNORMAL HIGH (ref 22–32)
TCO2: 49 mmol/L — ABNORMAL HIGH (ref 22–32)
TCO2: >50 mmol/L — ABNORMAL HIGH (ref 22–32)
TCO2: >50 mmol/L — ABNORMAL HIGH (ref 22–32)
pCO2 arterial: 106.4 mmHg (ref 32–48)
pCO2 arterial: 95.6 mmHg (ref 32–48)
pCO2 arterial: 107.3 mmHg (ref 32–48)
pCO2 arterial: 100.8 mmHg (ref 32–48)
pH, Arterial: 7.256 — ABNORMAL LOW (ref 7.35–7.45)
pH, Arterial: 7.303 — ABNORMAL LOW (ref 7.35–7.45)
pH, Arterial: 7.278 — ABNORMAL LOW (ref 7.35–7.45)
pH, Arterial: 7.317 — ABNORMAL LOW (ref 7.35–7.45)
pO2, Arterial: 148 mmHg — ABNORMAL HIGH (ref 83–108)
pO2, Arterial: 66 mmHg — ABNORMAL LOW (ref 83–108)
pO2, Arterial: 107 mmHg (ref 83–108)
pO2, Arterial: 136 mmHg — ABNORMAL HIGH (ref 83–108)

## 2021-05-24 LAB — BASIC METABOLIC PANEL
Anion gap: 6 (ref 5–15)
BUN: 47 mg/dL — ABNORMAL HIGH (ref 6–20)
CO2: 40 mmol/L — ABNORMAL HIGH (ref 22–32)
Calcium: 8.5 mg/dL — ABNORMAL LOW (ref 8.9–10.3)
Chloride: 107 mmol/L (ref 98–111)
Creatinine, Ser: 1.09 mg/dL (ref 0.61–1.24)
GFR, Estimated: >60 mL/min (ref >60)
Glucose, Bld: 156 mg/dL — ABNORMAL HIGH (ref 70–99)
Potassium: 4.5 mmol/L (ref 3.5–5.1)
Sodium: 153 mmol/L — ABNORMAL HIGH (ref 135–145)

## 2021-05-24 LAB — CBC WITH DIFFERENTIAL/PLATELET
Abs Immature Granulocytes: 0.3 10*3/uL — ABNORMAL HIGH (ref 0.00–0.07)
Basophils Absolute: 0 10*3/uL (ref 0.0–0.1)
Basophils Relative: 0 %
Eosinophils Absolute: 0 10*3/uL (ref 0.0–0.5)
Eosinophils Relative: 0 %
HCT: 28.1 % — ABNORMAL LOW (ref 39.0–52.0)
Hemoglobin: 7.4 g/dL — ABNORMAL LOW (ref 13.0–17.0)
Lymphocytes Relative: 2 %
Lymphs Abs: 0.3 10*3/uL — ABNORMAL LOW (ref 0.7–4.0)
MCH: 28 pg (ref 26.0–34.0)
MCHC: 26.3 g/dL — ABNORMAL LOW (ref 30.0–36.0)
MCV: 106.4 fL — ABNORMAL HIGH (ref 80.0–100.0)
Metamyelocytes Relative: 1 %
Monocytes Absolute: 0.5 10*3/uL (ref 0.1–1.0)
Monocytes Relative: 3 %
Myelocytes: 1 %
Neutro Abs: 15.3 10*3/uL — ABNORMAL HIGH (ref 1.7–7.7)
Neutrophils Relative %: 93 %
Platelets: 319 10*3/uL (ref 150–400)
RBC: 2.64 MIL/uL — ABNORMAL LOW (ref 4.22–5.81)
RDW: 17 % — ABNORMAL HIGH (ref 11.5–15.5)
WBC: 16.5 10*3/uL — ABNORMAL HIGH (ref 4.0–10.5)
nRBC: 5 /100 WBC — ABNORMAL HIGH
nRBC: 6.4 % — ABNORMAL HIGH (ref 0.0–0.2)

## 2021-05-24 LAB — GLUCOSE, CAPILLARY
Glucose-Capillary: 128 mg/dL — ABNORMAL HIGH (ref 70–99)
Glucose-Capillary: 128 mg/dL — ABNORMAL HIGH (ref 70–99)
Glucose-Capillary: 144 mg/dL — ABNORMAL HIGH (ref 70–99)
Glucose-Capillary: 151 mg/dL — ABNORMAL HIGH (ref 70–99)
Glucose-Capillary: 156 mg/dL — ABNORMAL HIGH (ref 70–99)
Glucose-Capillary: 156 mg/dL — ABNORMAL HIGH (ref 70–99)

## 2021-05-24 LAB — TRIGLYCERIDES: Triglycerides: 280 mg/dL — ABNORMAL HIGH (ref <150)

## 2021-05-24 MED ORDER — METOLAZONE 5 MG PO TABS
5.0000 mg | ORAL_TABLET | Freq: Three times a day (TID) | ORAL | Status: DC
  Filled 2021-05-24: qty 1

## 2021-05-24 MED ORDER — METOLAZONE 5 MG PO TABS
5.0000 mg | ORAL_TABLET | Freq: Three times a day (TID) | ORAL | Status: DC
  Administered 2021-05-24 (×3): 5 mg
  Filled 2021-05-24 (×3): qty 1

## 2021-05-24 MED ORDER — VANCOMYCIN HCL 1500 MG/300ML IV SOLN
1500.0000 mg | Freq: Once | INTRAVENOUS | Status: AC
  Administered 2021-05-24: 1500 mg via INTRAVENOUS
  Filled 2021-05-24: qty 300

## 2021-05-24 MED ORDER — VANCOMYCIN HCL 1250 MG/250ML IV SOLN
1250.0000 mg | Freq: Two times a day (BID) | INTRAVENOUS | Status: DC
  Administered 2021-05-25 – 2021-05-26 (×4): 1250 mg via INTRAVENOUS
  Filled 2021-05-24 (×6): qty 250
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
## 2021-05-24 NOTE — Progress Notes (Signed)
RT note- Patient proned again without difficulty.
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
## 2021-05-24 NOTE — Progress Notes (Signed)
RN spoke with CCM MD and CT. Per CT Left FA CT would need to be performed with Pt in supine position. Pt due to be placed in supine position tomorrow morning around 0900. CT aware. TRN also notified and possibly available to help for scans.
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
## 2021-05-24 NOTE — Progress Notes (Addendum)
RT note- patient supined

## 2021-05-25 ENCOUNTER — Inpatient Hospital Stay (HOSPITAL_COMMUNITY): Payer: Medicaid Other

## 2021-05-25 LAB — CBC WITH DIFFERENTIAL/PLATELET
Abs Immature Granulocytes: 0.57 10*3/uL — ABNORMAL HIGH (ref 0.00–0.07)
Basophils Absolute: 0.1 10*3/uL (ref 0.0–0.1)
Basophils Relative: 0 %
Eosinophils Absolute: 0.3 10*3/uL (ref 0.0–0.5)
Eosinophils Relative: 1 %
HCT: 29.3 % — ABNORMAL LOW (ref 39.0–52.0)
Hemoglobin: 8.1 g/dL — ABNORMAL LOW (ref 13.0–17.0)
Immature Granulocytes: 3 %
Lymphocytes Relative: 8 %
Lymphs Abs: 1.8 10*3/uL (ref 0.7–4.0)
MCH: 28.7 pg (ref 26.0–34.0)
MCHC: 27.6 g/dL — ABNORMAL LOW (ref 30.0–36.0)
MCV: 103.9 fL — ABNORMAL HIGH (ref 80.0–100.0)
Monocytes Absolute: 0.9 10*3/uL (ref 0.1–1.0)
Monocytes Relative: 4 %
Neutro Abs: 17.8 10*3/uL — ABNORMAL HIGH (ref 1.7–7.7)
Neutrophils Relative %: 84 %
Platelets: 299 10*3/uL (ref 150–400)
RBC: 2.82 MIL/uL — ABNORMAL LOW (ref 4.22–5.81)
RDW: 16.3 % — ABNORMAL HIGH (ref 11.5–15.5)
Smear Review: NORMAL
WBC: 21.4 10*3/uL — ABNORMAL HIGH (ref 4.0–10.5)
nRBC: 4.5 % — ABNORMAL HIGH (ref 0.0–0.2)

## 2021-05-25 LAB — POCT I-STAT 7, (LYTES, BLD GAS, ICA,H+H)
Acid-Base Excess: 28 mmol/L — ABNORMAL HIGH (ref 0.0–2.0)
Acid-Base Excess: 28 mmol/L — ABNORMAL HIGH (ref 0.0–2.0)
Bicarbonate: 56.3 mmol/L — ABNORMAL HIGH (ref 20.0–28.0)
Bicarbonate: 56.7 mmol/L — ABNORMAL HIGH (ref 20.0–28.0)
Calcium, Ion: 1.19 mmol/L (ref 1.15–1.40)
Calcium, Ion: 1.19 mmol/L (ref 1.15–1.40)
HCT: 25 % — ABNORMAL LOW (ref 39.0–52.0)
HCT: 27 % — ABNORMAL LOW (ref 39.0–52.0)
Hemoglobin: 8.5 g/dL — ABNORMAL LOW (ref 13.0–17.0)
Hemoglobin: 9.2 g/dL — ABNORMAL LOW (ref 13.0–17.0)
O2 Saturation: 96 %
O2 Saturation: 82 %
Patient temperature: 100.4
Patient temperature: 100
Potassium: 3.2 mmol/L — ABNORMAL LOW (ref 3.5–5.1)
Potassium: 3.3 mmol/L — ABNORMAL LOW (ref 3.5–5.1)
Sodium: 152 mmol/L — ABNORMAL HIGH (ref 135–145)
Sodium: 152 mmol/L — ABNORMAL HIGH (ref 135–145)
TCO2: >50 mmol/L — ABNORMAL HIGH (ref 22–32)
TCO2: >50 mmol/L — ABNORMAL HIGH (ref 22–32)
pCO2 arterial: 88.4 mmHg (ref 32–48)
pCO2 arterial: 91.1 mmHg (ref 32–48)
pH, Arterial: 7.416 (ref 7.35–7.45)
pH, Arterial: 7.406 (ref 7.35–7.45)
pO2, Arterial: 93 mmHg (ref 83–108)
pO2, Arterial: 52 mmHg — ABNORMAL LOW (ref 83–108)

## 2021-05-25 LAB — BASIC METABOLIC PANEL
BUN: 38 mg/dL — ABNORMAL HIGH (ref 6–20)
CO2: >45 mmol/L — ABNORMAL HIGH (ref 22–32)
Calcium: 8.7 mg/dL — ABNORMAL LOW (ref 8.9–10.3)
Chloride: 98 mmol/L (ref 98–111)
Creatinine, Ser: 0.9 mg/dL (ref 0.61–1.24)
GFR, Estimated: >60 mL/min (ref >60)
Glucose, Bld: 127 mg/dL — ABNORMAL HIGH (ref 70–99)
Potassium: 3.3 mmol/L — ABNORMAL LOW (ref 3.5–5.1)
Sodium: 153 mmol/L — ABNORMAL HIGH (ref 135–145)

## 2021-05-25 LAB — TRIGLYCERIDES: Triglycerides: 216 mg/dL — ABNORMAL HIGH (ref <150)

## 2021-05-25 LAB — GLUCOSE, CAPILLARY
Glucose-Capillary: 112 mg/dL — ABNORMAL HIGH (ref 70–99)
Glucose-Capillary: 128 mg/dL — ABNORMAL HIGH (ref 70–99)
Glucose-Capillary: 137 mg/dL — ABNORMAL HIGH (ref 70–99)
Glucose-Capillary: 145 mg/dL — ABNORMAL HIGH (ref 70–99)
Glucose-Capillary: 146 mg/dL — ABNORMAL HIGH (ref 70–99)
Glucose-Capillary: 152 mg/dL — ABNORMAL HIGH (ref 70–99)

## 2021-05-25 LAB — FOLATE: Folate: 11.3 ng/mL (ref >5.9)

## 2021-05-25 LAB — VITAMIN B12: Vitamin B-12: 651 pg/mL (ref 180–914)

## 2021-05-25 MED ORDER — DIAZEPAM 5 MG PO TABS
15.0000 mg | ORAL_TABLET | Freq: Four times a day (QID) | ORAL | Status: DC
  Administered 2021-05-25 – 2021-06-02 (×31): 15 mg
  Filled 2021-05-25 (×31): qty 3

## 2021-05-25 MED ORDER — POTASSIUM CHLORIDE 10 MEQ/50ML IV SOLN
10.0000 meq | INTRAVENOUS | Status: DC
  Administered 2021-05-25: 10 meq via INTRAVENOUS
  Filled 2021-05-25: qty 50

## 2021-05-25 MED ORDER — ACETAMINOPHEN 500 MG PO TABS
1000.0000 mg | ORAL_TABLET | Freq: Four times a day (QID) | ORAL | Status: DC
  Administered 2021-05-25 – 2021-06-15 (×78): 1000 mg
  Filled 2021-05-25 (×81): qty 2

## 2021-05-25 MED ORDER — FREE WATER
200.0000 mL | Status: DC
  Administered 2021-05-25 – 2021-05-27 (×17): 200 mL

## 2021-05-25 MED ORDER — FENTANYL BOLUS VIA INFUSION: 50.0000 ug | INTRAVENOUS | Status: DC | PRN

## 2021-05-25 MED ORDER — ACETAZOLAMIDE SODIUM 500 MG IJ SOLR
500.0000 mg | Freq: Once | INTRAMUSCULAR | Status: DC
Start: 2021-05-25 — End: 2021-05-25
  Filled 2021-05-25 (×2): qty 500

## 2021-05-25 MED ORDER — POTASSIUM CHLORIDE 20 MEQ PO PACK
40.0000 meq | PACK | ORAL | Status: AC
  Administered 2021-05-25 (×3): 40 meq
  Filled 2021-05-25 (×3): qty 2

## 2021-05-25 MED ORDER — METOLAZONE 5 MG PO TABS
10.0000 mg | ORAL_TABLET | Freq: Three times a day (TID) | ORAL | Status: DC
Start: 2021-05-25 — End: 2021-05-31
  Administered 2021-05-25 – 2021-05-31 (×19): 10 mg
  Filled 2021-05-25 (×22): qty 2

## 2021-05-25 MED ORDER — IOHEXOL 300 MG/ML  SOLN
100.0000 mL | Freq: Once | INTRAMUSCULAR | Status: AC | PRN
  Administered 2021-05-25: 80 mL via INTRAVENOUS

## 2021-05-25 MED ORDER — ACETAZOLAMIDE SODIUM 500 MG IJ SOLR
500.0000 mg | Freq: Once | INTRAMUSCULAR | Status: AC
  Administered 2021-05-25: 500 mg via INTRAVENOUS
  Filled 2021-05-25: qty 500

## 2021-05-25 MED ORDER — FENTANYL 2500MCG IN NS 250ML (10MCG/ML) PREMIX INFUSION: 50.0000 ug/h | INTRAVENOUS | Status: DC

## 2021-05-25 MED ORDER — DEXMEDETOMIDINE HCL IN NACL 400 MCG/100ML IV SOLN: 0.0000 ug/kg/h | INTRAVENOUS | Status: DC

## 2021-05-25 MED ORDER — VECURONIUM BROMIDE 10 MG IV SOLR
0.0000 ug/kg/min | Status: DC
  Administered 2021-05-26: 1.2 ug/kg/min via INTRAVENOUS
  Filled 2021-05-25: qty 100

## 2021-05-25 MED ORDER — FENTANYL CITRATE PF 50 MCG/ML IJ SOSY: 50.0000 ug | PREFILLED_SYRINGE | Freq: Once | INTRAMUSCULAR | Status: DC

## 2021-05-25 MED ORDER — MIDAZOLAM-SODIUM CHLORIDE 100-0.9 MG/100ML-% IV SOLN
0.0000 mg/h | INTRAVENOUS | Status: DC
  Administered 2021-05-25: 10 mg/h via INTRAVENOUS
  Administered 2021-05-26 (×2): 11 mg/h via INTRAVENOUS
  Administered 2021-05-26: 10 mg/h via INTRAVENOUS
  Administered 2021-05-27: 5 mg/h via INTRAVENOUS
  Administered 2021-05-27: 7 mg/h via INTRAVENOUS
  Administered 2021-05-28 – 2021-05-30 (×2): 3 mg/h via INTRAVENOUS
  Administered 2021-05-31: 2 mg/h via INTRAVENOUS
  Filled 2021-05-25 (×9): qty 100

## 2021-05-25 MED ORDER — MIDAZOLAM BOLUS VIA INFUSION
0.0000 mg | INTRAVENOUS | Status: DC | PRN
  Administered 2021-05-26: 2 mg via INTRAVENOUS
  Administered 2021-05-26 (×2): 3 mg via INTRAVENOUS
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
## 2021-05-25 NOTE — Progress Notes (Signed)
RT note- Patient taken to CT post supine, ETT retaped and positioned, ABG drawn.

## 2021-05-25 NOTE — Progress Notes (Signed)
   05/25/21 2045  Clinical Encounter Type  Visited With Patient  Visit Type Follow-up  Referral From Nurse  Consult/Referral To Chaplain  Spiritual Encounters  Spiritual Needs Prayer   Chaplain Tery Sanfilippo prayed at the request of the attending nurse. This note was prepared by Deneen Harts, M.Div..  For questions please contact by phone [phone].
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
## 2021-05-26 LAB — CBC WITH DIFFERENTIAL/PLATELET
Abs Immature Granulocytes: 0.33 10*3/uL — ABNORMAL HIGH (ref 0.00–0.07)
Basophils Absolute: 0.1 10*3/uL (ref 0.0–0.1)
Basophils Relative: 0 %
Eosinophils Absolute: 0.5 10*3/uL (ref 0.0–0.5)
Eosinophils Relative: 3 %
HCT: 29.6 % — ABNORMAL LOW (ref 39.0–52.0)
Hemoglobin: 7.9 g/dL — ABNORMAL LOW (ref 13.0–17.0)
Immature Granulocytes: 2 %
Lymphocytes Relative: 9 %
Lymphs Abs: 1.8 10*3/uL (ref 0.7–4.0)
MCH: 28 pg (ref 26.0–34.0)
MCHC: 26.7 g/dL — ABNORMAL LOW (ref 30.0–36.0)
MCV: 105 fL — ABNORMAL HIGH (ref 80.0–100.0)
Monocytes Absolute: 0.8 10*3/uL (ref 0.1–1.0)
Monocytes Relative: 4 %
Neutro Abs: 16 10*3/uL — ABNORMAL HIGH (ref 1.7–7.7)
Neutrophils Relative %: 82 %
Platelets: 283 10*3/uL (ref 150–400)
RBC: 2.82 MIL/uL — ABNORMAL LOW (ref 4.22–5.81)
RDW: 16.1 % — ABNORMAL HIGH (ref 11.5–15.5)
WBC: 19.4 10*3/uL — ABNORMAL HIGH (ref 4.0–10.5)
nRBC: 2.8 % — ABNORMAL HIGH (ref 0.0–0.2)

## 2021-05-26 LAB — GLUCOSE, CAPILLARY
Glucose-Capillary: 121 mg/dL — ABNORMAL HIGH (ref 70–99)
Glucose-Capillary: 128 mg/dL — ABNORMAL HIGH (ref 70–99)
Glucose-Capillary: 136 mg/dL — ABNORMAL HIGH (ref 70–99)
Glucose-Capillary: 136 mg/dL — ABNORMAL HIGH (ref 70–99)
Glucose-Capillary: 137 mg/dL — ABNORMAL HIGH (ref 70–99)
Glucose-Capillary: 151 mg/dL — ABNORMAL HIGH (ref 70–99)

## 2021-05-26 LAB — BASIC METABOLIC PANEL
BUN: 47 mg/dL — ABNORMAL HIGH (ref 6–20)
CO2: >45 mmol/L — ABNORMAL HIGH (ref 22–32)
Calcium: 8.6 mg/dL — ABNORMAL LOW (ref 8.9–10.3)
Chloride: 92 mmol/L — ABNORMAL LOW (ref 98–111)
Creatinine, Ser: 1 mg/dL (ref 0.61–1.24)
GFR, Estimated: >60 mL/min (ref >60)
Glucose, Bld: 135 mg/dL — ABNORMAL HIGH (ref 70–99)
Potassium: 3.5 mmol/L (ref 3.5–5.1)
Sodium: 151 mmol/L — ABNORMAL HIGH (ref 135–145)

## 2021-05-26 LAB — VANCOMYCIN, PEAK
Vancomycin Pk: >60 ug/mL (ref 30–40)
Vancomycin Pk: 57 ug/mL (ref 30–40)

## 2021-05-26 LAB — POCT I-STAT 7, (LYTES, BLD GAS, ICA,H+H)
Acid-Base Excess: 29 mmol/L — ABNORMAL HIGH (ref 0.0–2.0)
Bicarbonate: 57.8 mmol/L — ABNORMAL HIGH (ref 20.0–28.0)
Calcium, Ion: 1.14 mmol/L — ABNORMAL LOW (ref 1.15–1.40)
HCT: 27 % — ABNORMAL LOW (ref 39.0–52.0)
Hemoglobin: 9.2 g/dL — ABNORMAL LOW (ref 13.0–17.0)
O2 Saturation: 91 %
Patient temperature: 98.6
Potassium: 3.3 mmol/L — ABNORMAL LOW (ref 3.5–5.1)
Sodium: 150 mmol/L — ABNORMAL HIGH (ref 135–145)
TCO2: >50 mmol/L — ABNORMAL HIGH (ref 22–32)
pCO2 arterial: 87.2 mmHg (ref 32–48)
pH, Arterial: 7.429 (ref 7.35–7.45)
pO2, Arterial: 65 mmHg — ABNORMAL LOW (ref 83–108)

## 2021-05-26 LAB — TRIGLYCERIDES: Triglycerides: 209 mg/dL — ABNORMAL HIGH (ref <150)

## 2021-05-26 MED ORDER — STERILE WATER FOR INJECTION IJ SOLN
INTRAMUSCULAR | Status: AC
  Administered 2021-05-26: 10 mL
  Filled 2021-05-26: qty 10

## 2021-05-26 MED ORDER — ACETAZOLAMIDE SODIUM 500 MG IJ SOLR
500.0000 mg | Freq: Once | INTRAMUSCULAR | Status: AC
  Administered 2021-05-26: 500 mg via INTRAVENOUS
  Filled 2021-05-26: qty 500

## 2021-05-26 MED ORDER — POTASSIUM CHLORIDE 20 MEQ PO PACK
40.0000 meq | PACK | ORAL | Status: AC
  Administered 2021-05-26 (×3): 40 meq
  Filled 2021-05-26 (×3): qty 2
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
## 2021-05-27 LAB — POCT I-STAT 7, (LYTES, BLD GAS, ICA,H+H)
Acid-Base Excess: 27 mmol/L — ABNORMAL HIGH (ref 0.0–2.0)
Acid-Base Excess: 22 mmol/L — ABNORMAL HIGH (ref 0.0–2.0)
Bicarbonate: 54.1 mmol/L — ABNORMAL HIGH (ref 20.0–28.0)
Bicarbonate: 52.4 mmol/L — ABNORMAL HIGH (ref 20.0–28.0)
Calcium, Ion: 1.15 mmol/L (ref 1.15–1.40)
Calcium, Ion: 1.14 mmol/L — ABNORMAL LOW (ref 1.15–1.40)
HCT: 25 % — ABNORMAL LOW (ref 39.0–52.0)
HCT: 26 % — ABNORMAL LOW (ref 39.0–52.0)
Hemoglobin: 8.5 g/dL — ABNORMAL LOW (ref 13.0–17.0)
Hemoglobin: 8.8 g/dL — ABNORMAL LOW (ref 13.0–17.0)
O2 Saturation: 96 %
O2 Saturation: 89 %
Patient temperature: 97.8
Potassium: 3.4 mmol/L — ABNORMAL LOW (ref 3.5–5.1)
Potassium: 4 mmol/L (ref 3.5–5.1)
Sodium: 148 mmol/L — ABNORMAL HIGH (ref 135–145)
Sodium: 148 mmol/L — ABNORMAL HIGH (ref 135–145)
TCO2: >50 mmol/L — ABNORMAL HIGH (ref 22–32)
TCO2: >50 mmol/L — ABNORMAL HIGH (ref 22–32)
pCO2 arterial: 76.7 mmHg (ref 32–48)
pCO2 arterial: 106.4 mmHg (ref 32–48)
pH, Arterial: 7.455 — ABNORMAL HIGH (ref 7.35–7.45)
pH, Arterial: 7.3 — ABNORMAL LOW (ref 7.35–7.45)
pO2, Arterial: 86 mmHg (ref 83–108)
pO2, Arterial: 69 mmHg — ABNORMAL LOW (ref 83–108)

## 2021-05-27 LAB — BASIC METABOLIC PANEL
BUN: 59 mg/dL — ABNORMAL HIGH (ref 6–20)
CO2: >45 mmol/L — ABNORMAL HIGH (ref 22–32)
Calcium: 8.6 mg/dL — ABNORMAL LOW (ref 8.9–10.3)
Chloride: 92 mmol/L — ABNORMAL LOW (ref 98–111)
Creatinine, Ser: 0.97 mg/dL (ref 0.61–1.24)
GFR, Estimated: >60 mL/min (ref >60)
Glucose, Bld: 128 mg/dL — ABNORMAL HIGH (ref 70–99)
Potassium: 3.6 mmol/L (ref 3.5–5.1)
Sodium: 150 mmol/L — ABNORMAL HIGH (ref 135–145)

## 2021-05-27 LAB — CBC WITH DIFFERENTIAL/PLATELET
Abs Immature Granulocytes: 0.14 10*3/uL — ABNORMAL HIGH (ref 0.00–0.07)
Basophils Absolute: 0 10*3/uL (ref 0.0–0.1)
Basophils Relative: 0 %
Eosinophils Absolute: 0.6 10*3/uL — ABNORMAL HIGH (ref 0.0–0.5)
Eosinophils Relative: 4 %
HCT: 27.7 % — ABNORMAL LOW (ref 39.0–52.0)
Hemoglobin: 7.6 g/dL — ABNORMAL LOW (ref 13.0–17.0)
Immature Granulocytes: 1 %
Lymphocytes Relative: 10 %
Lymphs Abs: 1.7 10*3/uL (ref 0.7–4.0)
MCH: 28 pg (ref 26.0–34.0)
MCHC: 27.4 g/dL — ABNORMAL LOW (ref 30.0–36.0)
MCV: 102.2 fL — ABNORMAL HIGH (ref 80.0–100.0)
Monocytes Absolute: 0.6 10*3/uL (ref 0.1–1.0)
Monocytes Relative: 4 %
Neutro Abs: 12.9 10*3/uL — ABNORMAL HIGH (ref 1.7–7.7)
Neutrophils Relative %: 81 %
Platelets: 283 10*3/uL (ref 150–400)
RBC: 2.71 MIL/uL — ABNORMAL LOW (ref 4.22–5.81)
RDW: 16.4 % — ABNORMAL HIGH (ref 11.5–15.5)
WBC: 16 10*3/uL — ABNORMAL HIGH (ref 4.0–10.5)
nRBC: 2.6 % — ABNORMAL HIGH (ref 0.0–0.2)

## 2021-05-27 LAB — GLUCOSE, CAPILLARY
Glucose-Capillary: 146 mg/dL — ABNORMAL HIGH (ref 70–99)
Glucose-Capillary: 134 mg/dL — ABNORMAL HIGH (ref 70–99)
Glucose-Capillary: 164 mg/dL — ABNORMAL HIGH (ref 70–99)
Glucose-Capillary: 133 mg/dL — ABNORMAL HIGH (ref 70–99)
Glucose-Capillary: 132 mg/dL — ABNORMAL HIGH (ref 70–99)
Glucose-Capillary: 136 mg/dL — ABNORMAL HIGH (ref 70–99)

## 2021-05-27 LAB — VANCOMYCIN, TROUGH: Vancomycin Tr: 28 ug/mL (ref 15–20)

## 2021-05-27 LAB — TRIGLYCERIDES: Triglycerides: 213 mg/dL — ABNORMAL HIGH (ref <150)

## 2021-05-27 MED ORDER — BETHANECHOL CHLORIDE 10 MG PO TABS
10.0000 mg | ORAL_TABLET | Freq: Three times a day (TID) | ORAL | Status: DC
  Filled 2021-05-27: qty 1

## 2021-05-27 MED ORDER — MIDAZOLAM BOLUS VIA INFUSION
0.0000 mg | INTRAVENOUS | Status: DC | PRN
  Administered 2021-05-27 – 2021-05-28 (×4): 3 mg via INTRAVENOUS

## 2021-05-27 MED ORDER — FREE WATER
100.0000 mL | Status: DC
  Administered 2021-05-27 – 2021-05-28 (×7): 100 mL

## 2021-05-27 MED ORDER — POTASSIUM CHLORIDE 20 MEQ PO PACK
40.0000 meq | PACK | ORAL | Status: AC
  Administered 2021-05-27 (×2): 40 meq
  Filled 2021-05-27 (×2): qty 2

## 2021-05-27 MED ORDER — OXYCODONE HCL 5 MG/5ML PO SOLN
5.0000 mg | Freq: Four times a day (QID) | ORAL | Status: DC
  Administered 2021-05-27 – 2021-06-08 (×48): 5 mg
  Filled 2021-05-27 (×48): qty 5

## 2021-05-27 MED ORDER — BETHANECHOL CHLORIDE 10 MG PO TABS
10.0000 mg | ORAL_TABLET | Freq: Three times a day (TID) | ORAL | Status: DC
  Administered 2021-05-27 – 2021-06-09 (×39): 10 mg
  Filled 2021-05-27 (×38): qty 1

## 2021-05-27 MED ORDER — ACETAZOLAMIDE SODIUM 500 MG IJ SOLR
500.0000 mg | Freq: Two times a day (BID) | INTRAMUSCULAR | Status: AC
  Administered 2021-05-27 – 2021-05-28 (×4): 500 mg via INTRAVENOUS
  Filled 2021-05-27 (×4): qty 500

## 2021-05-27 MED ORDER — PANTOPRAZOLE SODIUM 40 MG IV SOLR: 40.0000 mg | INTRAVENOUS | Status: DC

## 2021-05-27 MED ORDER — VANCOMYCIN HCL 500 MG/100ML IV SOLN
500.0000 mg | Freq: Two times a day (BID) | INTRAVENOUS | Status: DC
  Administered 2021-05-27 – 2021-06-04 (×17): 500 mg via INTRAVENOUS
  Filled 2021-05-27 (×18): qty 100

## 2021-05-27 MED ORDER — PANTOPRAZOLE SODIUM 40 MG IV SOLR
40.0000 mg | INTRAVENOUS | Status: DC
Start: 2021-05-28 — End: 2021-06-09
  Administered 2021-05-28 – 2021-06-09 (×13): 40 mg via INTRAVENOUS
  Filled 2021-05-27 (×13): qty 10

## 2021-05-27 MED ORDER — VANCOMYCIN VARIABLE DOSE PER UNSTABLE RENAL FUNCTION (PHARMACIST DOSING): Status: DC
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
## 2021-05-27 NOTE — Progress Notes (Signed)
Lab called with critical vanc trough of 28. Pharmacist [facility] notified.
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
## 2021-05-28 LAB — BASIC METABOLIC PANEL
BUN: 66 mg/dL — ABNORMAL HIGH (ref 6–20)
CO2: >45 mmol/L — ABNORMAL HIGH (ref 22–32)
Calcium: 8.4 mg/dL — ABNORMAL LOW (ref 8.9–10.3)
Chloride: 91 mmol/L — ABNORMAL LOW (ref 98–111)
Creatinine, Ser: 0.98 mg/dL (ref 0.61–1.24)
GFR, Estimated: >60 mL/min (ref >60)
Glucose, Bld: 122 mg/dL — ABNORMAL HIGH (ref 70–99)
Potassium: 3.5 mmol/L (ref 3.5–5.1)
Sodium: 147 mmol/L — ABNORMAL HIGH (ref 135–145)

## 2021-05-28 LAB — POCT I-STAT 7, (LYTES, BLD GAS, ICA,H+H)
Acid-Base Excess: 25 mmol/L — ABNORMAL HIGH (ref 0.0–2.0)
Acid-Base Excess: 25 mmol/L — ABNORMAL HIGH (ref 0.0–2.0)
Bicarbonate: 52.2 mmol/L — ABNORMAL HIGH (ref 20.0–28.0)
Bicarbonate: 52.5 mmol/L — ABNORMAL HIGH (ref 20.0–28.0)
Calcium, Ion: 1.13 mmol/L — ABNORMAL LOW (ref 1.15–1.40)
Calcium, Ion: 1.14 mmol/L — ABNORMAL LOW (ref 1.15–1.40)
HCT: 27 % — ABNORMAL LOW (ref 39.0–52.0)
HCT: 26 % — ABNORMAL LOW (ref 39.0–52.0)
Hemoglobin: 9.2 g/dL — ABNORMAL LOW (ref 13.0–17.0)
Hemoglobin: 8.8 g/dL — ABNORMAL LOW (ref 13.0–17.0)
O2 Saturation: 94 %
O2 Saturation: 92 %
Patient temperature: 99
Patient temperature: 99.1
Potassium: 3.7 mmol/L (ref 3.5–5.1)
Potassium: 3.4 mmol/L — ABNORMAL LOW (ref 3.5–5.1)
Sodium: 146 mmol/L — ABNORMAL HIGH (ref 135–145)
Sodium: 145 mmol/L (ref 135–145)
TCO2: >50 mmol/L — ABNORMAL HIGH (ref 22–32)
TCO2: >50 mmol/L — ABNORMAL HIGH (ref 22–32)
pCO2 arterial: 71.8 mmHg (ref 32–48)
pCO2 arterial: 79 mmHg (ref 32–48)
pH, Arterial: 7.47 — ABNORMAL HIGH (ref 7.35–7.45)
pH, Arterial: 7.432 (ref 7.35–7.45)
pO2, Arterial: 72 mmHg — ABNORMAL LOW (ref 83–108)
pO2, Arterial: 69 mmHg — ABNORMAL LOW (ref 83–108)

## 2021-05-28 LAB — CBC
HCT: 25.9 % — ABNORMAL LOW (ref 39.0–52.0)
Hemoglobin: 7.4 g/dL — ABNORMAL LOW (ref 13.0–17.0)
MCH: 28.7 pg (ref 26.0–34.0)
MCHC: 28.6 g/dL — ABNORMAL LOW (ref 30.0–36.0)
MCV: 100.4 fL — ABNORMAL HIGH (ref 80.0–100.0)
Platelets: 309 10*3/uL (ref 150–400)
RBC: 2.58 MIL/uL — ABNORMAL LOW (ref 4.22–5.81)
RDW: 16.3 % — ABNORMAL HIGH (ref 11.5–15.5)
WBC: 12.4 10*3/uL — ABNORMAL HIGH (ref 4.0–10.5)
nRBC: 2.4 % — ABNORMAL HIGH (ref 0.0–0.2)

## 2021-05-28 LAB — TRIGLYCERIDES: Triglycerides: 241 mg/dL — ABNORMAL HIGH (ref <150)

## 2021-05-28 LAB — GLUCOSE, CAPILLARY
Glucose-Capillary: 136 mg/dL — ABNORMAL HIGH (ref 70–99)
Glucose-Capillary: 129 mg/dL — ABNORMAL HIGH (ref 70–99)
Glucose-Capillary: 148 mg/dL — ABNORMAL HIGH (ref 70–99)
Glucose-Capillary: 138 mg/dL — ABNORMAL HIGH (ref 70–99)
Glucose-Capillary: 131 mg/dL — ABNORMAL HIGH (ref 70–99)
Glucose-Capillary: 136 mg/dL — ABNORMAL HIGH (ref 70–99)

## 2021-05-28 MED ORDER — POTASSIUM CHLORIDE 20 MEQ PO PACK
40.0000 meq | PACK | ORAL | Status: AC
  Administered 2021-05-28 (×2): 40 meq
  Filled 2021-05-28 (×2): qty 2

## 2021-05-28 MED ORDER — FREE WATER
100.0000 mL | Freq: Four times a day (QID) | Status: DC
  Administered 2021-05-28 – 2021-05-29 (×2): 100 mL

## 2021-05-28 MED ORDER — INSULIN ASPART 100 UNIT/ML IJ SOLN
0.0000 [IU] | INTRAMUSCULAR | Status: DC
  Administered 2021-05-28 – 2021-05-29 (×8): 2 [IU] via SUBCUTANEOUS
  Administered 2021-05-29: 3 [IU] via SUBCUTANEOUS
  Administered 2021-05-30 (×5): 2 [IU] via SUBCUTANEOUS
  Administered 2021-05-30 – 2021-05-31 (×5): 3 [IU] via SUBCUTANEOUS
  Administered 2021-05-31: 2 [IU] via SUBCUTANEOUS
  Administered 2021-05-31 (×2): 3 [IU] via SUBCUTANEOUS
  Administered 2021-06-01: 5 [IU] via SUBCUTANEOUS
  Administered 2021-06-01 (×4): 3 [IU] via SUBCUTANEOUS
  Administered 2021-06-01: 5 [IU] via SUBCUTANEOUS
  Administered 2021-06-02: 3 [IU] via SUBCUTANEOUS
  Administered 2021-06-02: 2 [IU] via SUBCUTANEOUS
  Administered 2021-06-02: 5 [IU] via SUBCUTANEOUS
  Administered 2021-06-02 (×2): 2 [IU] via SUBCUTANEOUS
  Administered 2021-06-02 – 2021-06-03 (×7): 3 [IU] via SUBCUTANEOUS
  Administered 2021-06-04 (×2): 2 [IU] via SUBCUTANEOUS
  Administered 2021-06-04: 3 [IU] via SUBCUTANEOUS
  Administered 2021-06-04: 2 [IU] via SUBCUTANEOUS
  Administered 2021-06-04 – 2021-06-05 (×2): 3 [IU] via SUBCUTANEOUS
  Administered 2021-06-05 (×2): 2 [IU] via SUBCUTANEOUS
  Administered 2021-06-05: 3 [IU] via SUBCUTANEOUS
  Administered 2021-06-05 – 2021-06-06 (×3): 2 [IU] via SUBCUTANEOUS
  Administered 2021-06-06: 3 [IU] via SUBCUTANEOUS
  Administered 2021-06-06 (×5): 2 [IU] via SUBCUTANEOUS
  Administered 2021-06-07: 3 [IU] via SUBCUTANEOUS
  Administered 2021-06-07: 2 [IU] via SUBCUTANEOUS
  Administered 2021-06-07: 3 [IU] via SUBCUTANEOUS
  Administered 2021-06-07 – 2021-06-09 (×12): 2 [IU] via SUBCUTANEOUS
  Administered 2021-06-09: 3 [IU] via SUBCUTANEOUS
  Administered 2021-06-10 (×2): 2 [IU] via SUBCUTANEOUS
  Administered 2021-06-10: 3 [IU] via SUBCUTANEOUS
  Administered 2021-06-10 – 2021-06-11 (×6): 2 [IU] via SUBCUTANEOUS
  Administered 2021-06-11: 3 [IU] via SUBCUTANEOUS
  Administered 2021-06-11: 2 [IU] via SUBCUTANEOUS
  Administered 2021-06-12: 3 [IU] via SUBCUTANEOUS
  Administered 2021-06-12 – 2021-06-15 (×15): 2 [IU] via SUBCUTANEOUS
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
## 2021-05-29 LAB — POCT I-STAT 7, (LYTES, BLD GAS, ICA,H+H)
Acid-Base Excess: 23 mmol/L — ABNORMAL HIGH (ref 0.0–2.0)
Acid-Base Excess: 23 mmol/L — ABNORMAL HIGH (ref 0.0–2.0)
Bicarbonate: 51.8 mmol/L — ABNORMAL HIGH (ref 20.0–28.0)
Bicarbonate: 51.2 mmol/L — ABNORMAL HIGH (ref 20.0–28.0)
Calcium, Ion: 1.15 mmol/L (ref 1.15–1.40)
Calcium, Ion: 1.15 mmol/L (ref 1.15–1.40)
HCT: 28 % — ABNORMAL LOW (ref 39.0–52.0)
HCT: 28 % — ABNORMAL LOW (ref 39.0–52.0)
Hemoglobin: 9.5 g/dL — ABNORMAL LOW (ref 13.0–17.0)
Hemoglobin: 9.5 g/dL — ABNORMAL LOW (ref 13.0–17.0)
O2 Saturation: 83 %
O2 Saturation: 89 %
Patient temperature: 99
Patient temperature: 100.2
Potassium: 3.7 mmol/L (ref 3.5–5.1)
Potassium: 4 mmol/L (ref 3.5–5.1)
Sodium: 147 mmol/L — ABNORMAL HIGH (ref 135–145)
Sodium: 145 mmol/L (ref 135–145)
TCO2: >50 mmol/L — ABNORMAL HIGH (ref 22–32)
TCO2: >50 mmol/L — ABNORMAL HIGH (ref 22–32)
pCO2 arterial: 87.9 mmHg (ref 32–48)
pCO2 arterial: 84.9 mmHg (ref 32–48)
pH, Arterial: 7.379 (ref 7.35–7.45)
pH, Arterial: 7.392 (ref 7.35–7.45)
pO2, Arterial: 53 mmHg — ABNORMAL LOW (ref 83–108)
pO2, Arterial: 65 mmHg — ABNORMAL LOW (ref 83–108)

## 2021-05-29 LAB — GLUCOSE, CAPILLARY
Glucose-Capillary: 145 mg/dL — ABNORMAL HIGH (ref 70–99)
Glucose-Capillary: 145 mg/dL — ABNORMAL HIGH (ref 70–99)
Glucose-Capillary: 168 mg/dL — ABNORMAL HIGH (ref 70–99)
Glucose-Capillary: 135 mg/dL — ABNORMAL HIGH (ref 70–99)
Glucose-Capillary: 150 mg/dL — ABNORMAL HIGH (ref 70–99)
Glucose-Capillary: 153 mg/dL — ABNORMAL HIGH (ref 70–99)

## 2021-05-29 LAB — BASIC METABOLIC PANEL
BUN: 66 mg/dL — ABNORMAL HIGH (ref 6–20)
CO2: >45 mmol/L — ABNORMAL HIGH (ref 22–32)
Calcium: 8.9 mg/dL (ref 8.9–10.3)
Chloride: 93 mmol/L — ABNORMAL LOW (ref 98–111)
Creatinine, Ser: 0.92 mg/dL (ref 0.61–1.24)
GFR, Estimated: >60 mL/min (ref >60)
Glucose, Bld: 143 mg/dL — ABNORMAL HIGH (ref 70–99)
Potassium: 3.8 mmol/L (ref 3.5–5.1)
Sodium: 148 mmol/L — ABNORMAL HIGH (ref 135–145)

## 2021-05-29 LAB — CBC
HCT: 30.2 % — ABNORMAL LOW (ref 39.0–52.0)
Hemoglobin: 8.3 g/dL — ABNORMAL LOW (ref 13.0–17.0)
MCH: 27.9 pg (ref 26.0–34.0)
MCHC: 27.5 g/dL — ABNORMAL LOW (ref 30.0–36.0)
MCV: 101.7 fL — ABNORMAL HIGH (ref 80.0–100.0)
Platelets: 367 10*3/uL (ref 150–400)
RBC: 2.97 MIL/uL — ABNORMAL LOW (ref 4.22–5.81)
RDW: 16.4 % — ABNORMAL HIGH (ref 11.5–15.5)
WBC: 18.6 10*3/uL — ABNORMAL HIGH (ref 4.0–10.5)
nRBC: 1.7 % — ABNORMAL HIGH (ref 0.0–0.2)

## 2021-05-29 LAB — VANCOMYCIN, TROUGH: Vancomycin Tr: 15 ug/mL (ref 15–20)

## 2021-05-29 LAB — CULTURE, BLOOD (ROUTINE X 2)
Culture: NO GROWTH
Culture: NO GROWTH
Special Requests: ADEQUATE
Special Requests: ADEQUATE

## 2021-05-29 LAB — TRIGLYCERIDES: Triglycerides: 233 mg/dL — ABNORMAL HIGH (ref <150)

## 2021-05-29 LAB — VANCOMYCIN, PEAK: Vancomycin Pk: 30 ug/mL (ref 30–40)

## 2021-05-29 MED ORDER — FREE WATER
100.0000 mL | Freq: Three times a day (TID) | Status: DC
  Administered 2021-05-29 – 2021-06-02 (×13): 100 mL

## 2021-05-29 MED ORDER — IPRATROPIUM-ALBUTEROL 0.5-2.5 (3) MG/3ML IN SOLN
RESPIRATORY_TRACT | Status: AC
  Filled 2021-05-29: qty 3

## 2021-05-29 MED ORDER — SODIUM CHLORIDE 0.9 % IV SOLN
INTRAVENOUS | Status: DC | PRN
Start: 2021-05-29 — End: 2021-06-16
  Administered 2021-05-31: 10 mL/h via INTRAVENOUS

## 2021-05-29 MED ORDER — ACETAZOLAMIDE SODIUM 500 MG IJ SOLR
500.0000 mg | Freq: Two times a day (BID) | INTRAMUSCULAR | Status: AC
  Administered 2021-05-29 – 2021-05-30 (×4): 500 mg via INTRAVENOUS
  Filled 2021-05-29 (×5): qty 500
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
## 2021-05-29 NOTE — Progress Notes (Signed)
New PICC line requested by Dr. Tacy Learn. Order placed [id number]. Call from IV Team at 20 stating old PICC was not old. I stated pt having fevers, WBC etc. Was told to re-order as a "PICC exchange", and that PICC RN could discuss further with ordering provider as needed. Order changed to "exchange PICC" at 1450. Dr Tacy Learn wants to continue all 3 lumens.

## 2021-05-29 NOTE — Progress Notes (Signed)
Critical ABG values give to Dr. Chand, CCM. 

## 2021-05-29 NOTE — Progress Notes (Signed)
Sats running 87 for about one hr. RT notified, FIO2 increased to 70%. Sats now at 91.
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
## 2021-05-30 LAB — GLUCOSE, CAPILLARY
Glucose-Capillary: 140 mg/dL — ABNORMAL HIGH (ref 70–99)
Glucose-Capillary: 134 mg/dL — ABNORMAL HIGH (ref 70–99)
Glucose-Capillary: 139 mg/dL — ABNORMAL HIGH (ref 70–99)
Glucose-Capillary: 138 mg/dL — ABNORMAL HIGH (ref 70–99)
Glucose-Capillary: 156 mg/dL — ABNORMAL HIGH (ref 70–99)
Glucose-Capillary: 156 mg/dL — ABNORMAL HIGH (ref 70–99)

## 2021-05-30 LAB — URINALYSIS, ROUTINE W REFLEX MICROSCOPIC
Bacteria, UA: NONE SEEN
Bilirubin Urine: NEGATIVE
Glucose, UA: NEGATIVE mg/dL
Hgb urine dipstick: NEGATIVE
Ketones, ur: NEGATIVE mg/dL
Leukocytes,Ua: NEGATIVE
Nitrite: NEGATIVE
Protein, ur: 30 mg/dL — AB
Specific Gravity, Urine: 1.015 (ref 1.005–1.030)
pH: 8 (ref 5.0–8.0)

## 2021-05-30 LAB — BASIC METABOLIC PANEL
BUN: 80 mg/dL — ABNORMAL HIGH (ref 6–20)
CO2: >45 mmol/L — ABNORMAL HIGH (ref 22–32)
Calcium: 8.3 mg/dL — ABNORMAL LOW (ref 8.9–10.3)
Chloride: 93 mmol/L — ABNORMAL LOW (ref 98–111)
Creatinine, Ser: 1.21 mg/dL (ref 0.61–1.24)
GFR, Estimated: >60 mL/min (ref >60)
Glucose, Bld: 147 mg/dL — ABNORMAL HIGH (ref 70–99)
Potassium: 3.8 mmol/L (ref 3.5–5.1)
Sodium: 149 mmol/L — ABNORMAL HIGH (ref 135–145)

## 2021-05-30 LAB — CBC
HCT: 29.6 % — ABNORMAL LOW (ref 39.0–52.0)
Hemoglobin: 8 g/dL — ABNORMAL LOW (ref 13.0–17.0)
MCH: 28.3 pg (ref 26.0–34.0)
MCHC: 27 g/dL — ABNORMAL LOW (ref 30.0–36.0)
MCV: 104.6 fL — ABNORMAL HIGH (ref 80.0–100.0)
Platelets: 342 10*3/uL (ref 150–400)
RBC: 2.83 MIL/uL — ABNORMAL LOW (ref 4.22–5.81)
RDW: 17.2 % — ABNORMAL HIGH (ref 11.5–15.5)
WBC: 24.7 10*3/uL — ABNORMAL HIGH (ref 4.0–10.5)
nRBC: 1.3 % — ABNORMAL HIGH (ref 0.0–0.2)

## 2021-05-30 LAB — POCT I-STAT 7, (LYTES, BLD GAS, ICA,H+H)
Acid-Base Excess: 26 mmol/L — ABNORMAL HIGH (ref 0.0–2.0)
Bicarbonate: 54.5 mmol/L — ABNORMAL HIGH (ref 20.0–28.0)
Calcium, Ion: 1.13 mmol/L — ABNORMAL LOW (ref 1.15–1.40)
HCT: 28 % — ABNORMAL LOW (ref 39.0–52.0)
Hemoglobin: 9.5 g/dL — ABNORMAL LOW (ref 13.0–17.0)
O2 Saturation: 87 %
Patient temperature: 100.4
Potassium: 3.4 mmol/L — ABNORMAL LOW (ref 3.5–5.1)
Sodium: 148 mmol/L — ABNORMAL HIGH (ref 135–145)
TCO2: >50 mmol/L — ABNORMAL HIGH (ref 22–32)
pCO2 arterial: 91.6 mmHg (ref 32–48)
pH, Arterial: 7.387 (ref 7.35–7.45)
pO2, Arterial: 62 mmHg — ABNORMAL LOW (ref 83–108)

## 2021-05-30 MED ORDER — POTASSIUM CHLORIDE 20 MEQ PO PACK
40.0000 meq | PACK | Freq: Once | ORAL | Status: AC
  Administered 2021-05-30: 40 meq
  Filled 2021-05-30: qty 2
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
## 2021-05-30 NOTE — Progress Notes (Signed)
1040 sedation resumed at half of former dose, (2mg  Dilaudid, 1.5 versed)
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
## 2021-05-30 NOTE — Progress Notes (Signed)
VAST consult for PIV placement and PICC removal:  Secure chat sent to Lovick MD. Patient has a RUE triple lumen PICC with multiple DVTs to the RUE. There is a request to pull the PICC and place multiple accesses on the LUE to accommodate the multiple continuous drips the patient is prescribed. Davene Costain RN, Braulio Bosch RN, Fredrich Birks RN, and [facility] RN have all assessed this patient and noted significant swelling to the LUE.   For best patient care and to ensure safety to the patient, a few recommendations from the vascular access team have been shared with the providers and bedside nurses.   1) Obtain an additional BUE venous duplex (last reading was on 05/01/2021) so that the vascular team can be ensured that placement of a vascular access on the LUE will not cause patient harm.  2) Delay the removal of the PICC until the palliative meeting scheduled for 5/30 at 11am with mom to determine the plan of care.  3) Dr Daiva Eves ID at 1730 on 5/28 states ok to leave PICC in due to likely not the source of infection. States if PICC is removed, then should have a 48 hour line holiday if possible, otherwise a temporary CVC can be placed.

## 2021-05-30 NOTE — Progress Notes (Signed)
  Transition of Care (TOC) Screening Note   Patient Details  Name: Melquan A Schlup Date of Birth: 31-Aug-1990   Transition of Care (TOC) CM/SW Contact:    Dailyn Reith C Tarpley-Carter, LCSWA Phone Number: 05/30/2021, 11:45 AM    Transition of Care Department (TOC) has reviewed patient and no TOC needs have been identified at this time. We will continue to monitor patient advancement through interdisciplinary progression rounds.  TOC will continue to follow for disposition as patient progresses.  Jaquavis Felmlee Tarpley-Carter, MSW, LCSW-A Pronouns:  She/Her/Hers [facility] HealthTransitions of Care Clinical Social Worker Direct Number:  [phone] [email or web address]

## 2021-05-30 NOTE — Progress Notes (Signed)
Hi temp 103.8. Sched tylenol, cold ice bath given, 2 fans applied. Esophageal probe placed for cont monitoring. MD notified. Temp down to 101.8 currently.

## 2021-05-30 NOTE — Progress Notes (Signed)
Trauma Event Note  Reason for Call : Spoke with RN about increasing temp - highest read 103.8 axillary.  See RN not for interventions, temp now down and continuous monitoring with esophageal probe. MD notified.  Park Pope Jerid Catherman  Trauma Response RN  Please call TRN at [phone] for further assistance.

## 2021-05-30 NOTE — Progress Notes (Signed)
Sedation paused

## 2021-05-31 ENCOUNTER — Inpatient Hospital Stay (HOSPITAL_COMMUNITY): Payer: Medicaid Other

## 2021-05-31 LAB — GLUCOSE, CAPILLARY
Glucose-Capillary: 152 mg/dL — ABNORMAL HIGH (ref 70–99)
Glucose-Capillary: 151 mg/dL — ABNORMAL HIGH (ref 70–99)
Glucose-Capillary: 165 mg/dL — ABNORMAL HIGH (ref 70–99)
Glucose-Capillary: 152 mg/dL — ABNORMAL HIGH (ref 70–99)
Glucose-Capillary: 152 mg/dL — ABNORMAL HIGH (ref 70–99)
Glucose-Capillary: 143 mg/dL — ABNORMAL HIGH (ref 70–99)

## 2021-05-31 LAB — POCT I-STAT 7, (LYTES, BLD GAS, ICA,H+H)
Acid-Base Excess: 23 mmol/L — ABNORMAL HIGH (ref 0.0–2.0)
Bicarbonate: 53.1 mmol/L — ABNORMAL HIGH (ref 20.0–28.0)
Calcium, Ion: 1.11 mmol/L — ABNORMAL LOW (ref 1.15–1.40)
HCT: 30 % — ABNORMAL LOW (ref 39.0–52.0)
Hemoglobin: 10.2 g/dL — ABNORMAL LOW (ref 13.0–17.0)
O2 Saturation: 91 %
Patient temperature: 37.9
Potassium: 3.9 mmol/L (ref 3.5–5.1)
Sodium: 149 mmol/L — ABNORMAL HIGH (ref 135–145)
TCO2: >50 mmol/L — ABNORMAL HIGH (ref 22–32)
pCO2 arterial: 103.2 mmHg (ref 32–48)
pH, Arterial: 7.323 — ABNORMAL LOW (ref 7.35–7.45)
pO2, Arterial: 75 mmHg — ABNORMAL LOW (ref 83–108)

## 2021-05-31 LAB — BASIC METABOLIC PANEL
BUN: 105 mg/dL — ABNORMAL HIGH (ref 6–20)
CO2: >45 mmol/L — ABNORMAL HIGH (ref 22–32)
Calcium: 8.4 mg/dL — ABNORMAL LOW (ref 8.9–10.3)
Chloride: 93 mmol/L — ABNORMAL LOW (ref 98–111)
Creatinine, Ser: 1.44 mg/dL — ABNORMAL HIGH (ref 0.61–1.24)
GFR, Estimated: >60 mL/min (ref >60)
Glucose, Bld: 152 mg/dL — ABNORMAL HIGH (ref 70–99)
Potassium: 3.8 mmol/L (ref 3.5–5.1)
Sodium: 148 mmol/L — ABNORMAL HIGH (ref 135–145)

## 2021-05-31 LAB — CBC
HCT: 28.6 % — ABNORMAL LOW (ref 39.0–52.0)
Hemoglobin: 7.7 g/dL — ABNORMAL LOW (ref 13.0–17.0)
MCH: 28.8 pg (ref 26.0–34.0)
MCHC: 26.9 g/dL — ABNORMAL LOW (ref 30.0–36.0)
MCV: 107.1 fL — ABNORMAL HIGH (ref 80.0–100.0)
Platelets: 331 10*3/uL (ref 150–400)
RBC: 2.67 MIL/uL — ABNORMAL LOW (ref 4.22–5.81)
RDW: 17.3 % — ABNORMAL HIGH (ref 11.5–15.5)
WBC: 22 10*3/uL — ABNORMAL HIGH (ref 4.0–10.5)
nRBC: 0.3 % — ABNORMAL HIGH (ref 0.0–0.2)

## 2021-05-31 MED ORDER — SODIUM CHLORIDE 0.9 % IV SOLN
2.0000 g | INTRAVENOUS | Status: AC
  Administered 2021-05-31 – 2021-06-06 (×7): 2 g via INTRAVENOUS
  Filled 2021-05-31 (×8): qty 20

## 2021-05-31 MED ORDER — ACETAZOLAMIDE SODIUM 500 MG IJ SOLR
500.0000 mg | Freq: Two times a day (BID) | INTRAMUSCULAR | Status: AC
  Administered 2021-05-31 – 2021-06-01 (×4): 500 mg via INTRAVENOUS
  Filled 2021-05-31 (×4): qty 500
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
## 2021-05-31 NOTE — TOC CAGE-AID Note (Signed)
Transition of Care [facility]) - CAGE-AID Screening   Patient Details  Name: Nathan West MRN: [id number] Date of Birth: Jun 24, 1990  Transition of Care [facility]) CM/SW Contact:    Nathan West, [place] Phone Number: 05/31/2021, 12:43 PM   Clinical Narrative: Pt is unable to participate in Cage Aid.     Nathan West, MSW, LCSW-A Pronouns:  She/Her/Hers [facility] HealthTransitions of Care Clinical Social Worker Direct Number:  [phone] [email or web address]  CAGE-AID Screening: Substance Abuse Screening unable to be completed due to: : Patient unable to participate             Substance Abuse Education Offered: Yes  Substance abuse interventions: Scientist, clinical (histocompatibility and immunogenetics)
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
## 2021-06-01 LAB — GLUCOSE, CAPILLARY
Glucose-Capillary: 158 mg/dL — ABNORMAL HIGH (ref 70–99)
Glucose-Capillary: 159 mg/dL — ABNORMAL HIGH (ref 70–99)
Glucose-Capillary: 169 mg/dL — ABNORMAL HIGH (ref 70–99)
Glucose-Capillary: 173 mg/dL — ABNORMAL HIGH (ref 70–99)
Glucose-Capillary: 202 mg/dL — ABNORMAL HIGH (ref 70–99)
Glucose-Capillary: 206 mg/dL — ABNORMAL HIGH (ref 70–99)

## 2021-06-01 LAB — BASIC METABOLIC PANEL
Anion gap: 11 (ref 5–15)
BUN: 104 mg/dL — ABNORMAL HIGH (ref 6–20)
CO2: 44 mmol/L — ABNORMAL HIGH (ref 22–32)
Calcium: 8.1 mg/dL — ABNORMAL LOW (ref 8.9–10.3)
Chloride: 96 mmol/L — ABNORMAL LOW (ref 98–111)
Creatinine, Ser: 1.21 mg/dL (ref 0.61–1.24)
GFR, Estimated: >60 mL/min (ref >60)
Glucose, Bld: 152 mg/dL — ABNORMAL HIGH (ref 70–99)
Potassium: 3.3 mmol/L — ABNORMAL LOW (ref 3.5–5.1)
Sodium: 151 mmol/L — ABNORMAL HIGH (ref 135–145)

## 2021-06-01 LAB — CBC
HCT: 26 % — ABNORMAL LOW (ref 39.0–52.0)
Hemoglobin: 7.1 g/dL — ABNORMAL LOW (ref 13.0–17.0)
MCH: 28.7 pg (ref 26.0–34.0)
MCHC: 27.3 g/dL — ABNORMAL LOW (ref 30.0–36.0)
MCV: 105.3 fL — ABNORMAL HIGH (ref 80.0–100.0)
Platelets: 321 10*3/uL (ref 150–400)
RBC: 2.47 MIL/uL — ABNORMAL LOW (ref 4.22–5.81)
RDW: 17.3 % — ABNORMAL HIGH (ref 11.5–15.5)
WBC: 15.9 10*3/uL — ABNORMAL HIGH (ref 4.0–10.5)
nRBC: 0.6 % — ABNORMAL HIGH (ref 0.0–0.2)

## 2021-06-01 MED ORDER — POTASSIUM CHLORIDE 20 MEQ PO PACK
40.0000 meq | PACK | ORAL | Status: AC
  Administered 2021-06-01 (×2): 40 meq
  Filled 2021-06-01 (×2): qty 2

## 2021-06-01 MED ORDER — FUROSEMIDE 10 MG/ML IJ SOLN
60.0000 mg | Freq: Once | INTRAMUSCULAR | Status: AC
  Administered 2021-06-01: 60 mg via INTRAVENOUS
  Filled 2021-06-01: qty 6

## 2021-06-01 MED ORDER — METOPROLOL TARTRATE 50 MG PO TABS
50.0000 mg | ORAL_TABLET | Freq: Three times a day (TID) | ORAL | Status: DC
  Administered 2021-06-01 – 2021-06-09 (×24): 50 mg
  Filled 2021-06-01 (×24): qty 1

## 2021-06-01 MED ORDER — STERILE WATER FOR INJECTION IJ SOLN
INTRAMUSCULAR | Status: AC
  Administered 2021-06-01: 5 mL
  Filled 2021-06-01: qty 10

## 2021-06-01 MED ORDER — METOPROLOL TARTRATE 5 MG/5ML IV SOLN
5.0000 mg | INTRAVENOUS | Status: DC | PRN
  Administered 2021-06-01 – 2021-06-10 (×7): 5 mg via INTRAVENOUS
  Filled 2021-06-01 (×2): qty 5
  Filled 2021-06-01: qty 10
  Filled 2021-06-01: qty 5
  Filled 2021-06-01: qty 10
  Filled 2021-06-01 (×2): qty 5

## 2021-06-01 NOTE — Progress Notes (Signed)
RT NOTE: also holding CPT this AM due to HR of 130s.  RN stated she had just suctioned patient and was able to obtain a copious amount of thick, yellow secretions.  Will continue to monitor.

## 2021-06-01 NOTE — Progress Notes (Signed)
RT NOTE: patient does not meet SBT criteria for this AM due to PEEP, FIO2, and hemodynamics at this time.  Tolerating current ventilator settings well.  Will continue to monitor.
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
## 2021-06-02 LAB — GLUCOSE, CAPILLARY
Glucose-Capillary: 135 mg/dL — ABNORMAL HIGH (ref 70–99)
Glucose-Capillary: 141 mg/dL — ABNORMAL HIGH (ref 70–99)
Glucose-Capillary: 196 mg/dL — ABNORMAL HIGH (ref 70–99)
Glucose-Capillary: 150 mg/dL — ABNORMAL HIGH (ref 70–99)
Glucose-Capillary: 178 mg/dL — ABNORMAL HIGH (ref 70–99)
Glucose-Capillary: 206 mg/dL — ABNORMAL HIGH (ref 70–99)

## 2021-06-02 LAB — BASIC METABOLIC PANEL
Anion gap: 11 (ref 5–15)
BUN: 110 mg/dL — ABNORMAL HIGH (ref 6–20)
CO2: 43 mmol/L — ABNORMAL HIGH (ref 22–32)
Calcium: 8.2 mg/dL — ABNORMAL LOW (ref 8.9–10.3)
Chloride: 104 mmol/L (ref 98–111)
Creatinine, Ser: 1.26 mg/dL — ABNORMAL HIGH (ref 0.61–1.24)
GFR, Estimated: >60 mL/min (ref >60)
Glucose, Bld: 159 mg/dL — ABNORMAL HIGH (ref 70–99)
Potassium: 3.5 mmol/L (ref 3.5–5.1)
Sodium: 158 mmol/L — ABNORMAL HIGH (ref 135–145)

## 2021-06-02 LAB — CBC
HCT: 26 % — ABNORMAL LOW (ref 39.0–52.0)
Hemoglobin: 7 g/dL — ABNORMAL LOW (ref 13.0–17.0)
MCH: 28.2 pg (ref 26.0–34.0)
MCHC: 26.9 g/dL — ABNORMAL LOW (ref 30.0–36.0)
MCV: 104.8 fL — ABNORMAL HIGH (ref 80.0–100.0)
Platelets: 298 10*3/uL (ref 150–400)
RBC: 2.48 MIL/uL — ABNORMAL LOW (ref 4.22–5.81)
RDW: 17.5 % — ABNORMAL HIGH (ref 11.5–15.5)
WBC: 15.3 10*3/uL — ABNORMAL HIGH (ref 4.0–10.5)
nRBC: 0.7 % — ABNORMAL HIGH (ref 0.0–0.2)

## 2021-06-02 MED ORDER — QUETIAPINE FUMARATE 100 MG PO TABS
100.0000 mg | ORAL_TABLET | Freq: Two times a day (BID) | ORAL | Status: DC
  Administered 2021-06-02 – 2021-06-15 (×27): 100 mg
  Filled 2021-06-02 (×27): qty 1

## 2021-06-02 MED ORDER — METOLAZONE 5 MG PO TABS
10.0000 mg | ORAL_TABLET | Freq: Two times a day (BID) | ORAL | Status: DC
  Administered 2021-06-02 – 2021-06-05 (×7): 10 mg
  Filled 2021-06-02 (×7): qty 2

## 2021-06-02 MED ORDER — DIAZEPAM 5 MG PO TABS
10.0000 mg | ORAL_TABLET | Freq: Four times a day (QID) | ORAL | Status: DC
  Administered 2021-06-02 – 2021-06-06 (×16): 10 mg
  Filled 2021-06-02 (×16): qty 2

## 2021-06-02 MED ORDER — FREE WATER
200.0000 mL | Freq: Four times a day (QID) | Status: DC
  Administered 2021-06-02 – 2021-06-03 (×4): 200 mL

## 2021-06-02 NOTE — Progress Notes (Signed)
Wasted 15mL of Dilaudid from the bag witness by Ave Filter, RN.

## 2021-06-02 NOTE — Progress Notes (Signed)
Trauma Event Note   TRN at bedside to round. Sedative infusion turned off this afternoon, patient with no response to verbal/painful stimuli, no gag/cough reflex. PERRLA 62mm. Checked in with primary nurse, who has no needs for TRN at this time.   Last imported Vital Signs BP 128/61   Pulse (!) 119   Temp (!) 100.8 F (38.2 C)   Resp (!) 30   Ht 5\' 9"  (1.753 m)   Wt 170 lb 13.7 oz (77.5 kg)   SpO2 98%   BMI 25.23 kg/m   Trending CBC Recent Labs    05/30/21 0341 05/30/21 0910 05/31/21 0348 05/31/21 0634 06/01/21 0522  WBC 24.7*  --   --  22.0* 15.9*  HGB 8.0*   < > 10.2* 7.7* 7.1*  HCT 29.6*   < > 30.0* 28.6* 26.0*  PLT 342  --   --  331 321   < > = values in this interval not displayed.    Trending Coag's No results for input(s): APTT, INR in the last 72 hours.  Trending BMET Recent Labs    05/30/21 0341 05/30/21 0910 05/31/21 0348 05/31/21 0634 06/01/21 0522  NA 149*   < > 149* 148* 151*  K 3.8   < > 3.9 3.8 3.3*  CL 93*  --   --  93* 96*  CO2 >45*  --   --  >45* 44*  BUN 80*  --   --  105* 104*  CREATININE 1.21  --   --  1.44* 1.21  GLUCOSE 147*  --   --  152* 152*   < > = values in this interval not displayed.      Nathan West Nathan West  Trauma Response RN  Please call TRN at [phone] for further assistance.
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
## 2021-06-02 DEATH — deceased

## 2021-06-03 ENCOUNTER — Inpatient Hospital Stay (HOSPITAL_COMMUNITY): Payer: Medicaid Other

## 2021-06-03 LAB — POCT I-STAT 7, (LYTES, BLD GAS, ICA,H+H)
Acid-Base Excess: 22 mmol/L — ABNORMAL HIGH (ref 0.0–2.0)
Acid-Base Excess: 23 mmol/L — ABNORMAL HIGH (ref 0.0–2.0)
Bicarbonate: 48.1 mmol/L — ABNORMAL HIGH (ref 20.0–28.0)
Bicarbonate: 49.4 mmol/L — ABNORMAL HIGH (ref 20.0–28.0)
Calcium, Ion: 1.12 mmol/L — ABNORMAL LOW (ref 1.15–1.40)
Calcium, Ion: 1.13 mmol/L — ABNORMAL LOW (ref 1.15–1.40)
HCT: 23 % — ABNORMAL LOW (ref 39.0–52.0)
HCT: 24 % — ABNORMAL LOW (ref 39.0–52.0)
Hemoglobin: 7.8 g/dL — ABNORMAL LOW (ref 13.0–17.0)
Hemoglobin: 8.2 g/dL — ABNORMAL LOW (ref 13.0–17.0)
O2 Saturation: 74 %
O2 Saturation: 89 %
Patient temperature: 99.1
Patient temperature: 99.1
Potassium: 2.6 mmol/L — CL (ref 3.5–5.1)
Potassium: 2.6 mmol/L — CL (ref 3.5–5.1)
Sodium: 162 mmol/L (ref 135–145)
Sodium: 163 mmol/L (ref 135–145)
TCO2: >50 mmol/L — ABNORMAL HIGH (ref 22–32)
TCO2: >50 mmol/L — ABNORMAL HIGH (ref 22–32)
pCO2 arterial: 64.5 mmHg — ABNORMAL HIGH (ref 32–48)
pCO2 arterial: 66.7 mmHg (ref 32–48)
pH, Arterial: 7.467 — ABNORMAL HIGH (ref 7.35–7.45)
pH, Arterial: 7.494 — ABNORMAL HIGH (ref 7.35–7.45)
pO2, Arterial: 39 mmHg — CL (ref 83–108)
pO2, Arterial: 57 mmHg — ABNORMAL LOW (ref 83–108)

## 2021-06-03 LAB — CBC
HCT: 26.5 % — ABNORMAL LOW (ref 39.0–52.0)
Hemoglobin: 7.3 g/dL — ABNORMAL LOW (ref 13.0–17.0)
MCH: 28.5 pg (ref 26.0–34.0)
MCHC: 27.5 g/dL — ABNORMAL LOW (ref 30.0–36.0)
MCV: 103.5 fL — ABNORMAL HIGH (ref 80.0–100.0)
Platelets: 309 10*3/uL (ref 150–400)
RBC: 2.56 MIL/uL — ABNORMAL LOW (ref 4.22–5.81)
RDW: 17.8 % — ABNORMAL HIGH (ref 11.5–15.5)
WBC: 17.2 10*3/uL — ABNORMAL HIGH (ref 4.0–10.5)
nRBC: 1.2 % — ABNORMAL HIGH (ref 0.0–0.2)

## 2021-06-03 LAB — CULTURE, RESPIRATORY W GRAM STAIN: Gram Stain: NONE SEEN

## 2021-06-03 LAB — BLOOD GAS, ARTERIAL
Acid-Base Excess: 22.9 mmol/L — ABNORMAL HIGH (ref 0.0–2.0)
Bicarbonate: 49.8 mmol/L — ABNORMAL HIGH (ref 20.0–28.0)
Drawn by: 39899
O2 Saturation: 97.4 %
Patient temperature: 38
pCO2 arterial: 64 mmHg — ABNORMAL HIGH (ref 32–48)
pH, Arterial: 7.5 — ABNORMAL HIGH (ref 7.35–7.45)
pO2, Arterial: 79 mmHg — ABNORMAL LOW (ref 83–108)

## 2021-06-03 LAB — BASIC METABOLIC PANEL
Anion gap: 9 (ref 5–15)
BUN: 86 mg/dL — ABNORMAL HIGH (ref 6–20)
BUN: 72 mg/dL — ABNORMAL HIGH (ref 6–20)
CO2: >45 mmol/L — ABNORMAL HIGH (ref 22–32)
CO2: 41 mmol/L — ABNORMAL HIGH (ref 22–32)
Calcium: 8.5 mg/dL — ABNORMAL LOW (ref 8.9–10.3)
Calcium: 8.5 mg/dL — ABNORMAL LOW (ref 8.9–10.3)
Chloride: 110 mmol/L (ref 98–111)
Chloride: 112 mmol/L — ABNORMAL HIGH (ref 98–111)
Creatinine, Ser: 1.05 mg/dL (ref 0.61–1.24)
Creatinine, Ser: 0.99 mg/dL (ref 0.61–1.24)
GFR, Estimated: >60 mL/min (ref >60)
GFR, Estimated: >60 mL/min (ref >60)
Glucose, Bld: 168 mg/dL — ABNORMAL HIGH (ref 70–99)
Glucose, Bld: 186 mg/dL — ABNORMAL HIGH (ref 70–99)
Potassium: 2.8 mmol/L — ABNORMAL LOW (ref 3.5–5.1)
Potassium: 3.5 mmol/L (ref 3.5–5.1)
Sodium: 164 mmol/L (ref 135–145)
Sodium: 162 mmol/L (ref 135–145)

## 2021-06-03 LAB — GLUCOSE, CAPILLARY
Glucose-Capillary: 154 mg/dL — ABNORMAL HIGH (ref 70–99)
Glucose-Capillary: 155 mg/dL — ABNORMAL HIGH (ref 70–99)
Glucose-Capillary: 163 mg/dL — ABNORMAL HIGH (ref 70–99)
Glucose-Capillary: 164 mg/dL — ABNORMAL HIGH (ref 70–99)
Glucose-Capillary: 170 mg/dL — ABNORMAL HIGH (ref 70–99)
Glucose-Capillary: 180 mg/dL — ABNORMAL HIGH (ref 70–99)

## 2021-06-03 MED ORDER — DEXTROSE 5 % IV SOLN: INTRAVENOUS | Status: AC

## 2021-06-03 MED ORDER — POTASSIUM CHLORIDE 20 MEQ PO PACK
40.0000 meq | PACK | ORAL | Status: AC
  Administered 2021-06-03 (×2): 40 meq
  Filled 2021-06-03 (×2): qty 2

## 2021-06-03 MED ORDER — ACETAZOLAMIDE SODIUM 500 MG IJ SOLR
500.0000 mg | Freq: Two times a day (BID) | INTRAMUSCULAR | Status: DC
  Administered 2021-06-03 – 2021-06-05 (×5): 500 mg via INTRAVENOUS
  Filled 2021-06-03 (×5): qty 500

## 2021-06-03 MED ORDER — POTASSIUM CHLORIDE 20 MEQ PO PACK
40.0000 meq | PACK | ORAL | Status: DC
  Administered 2021-06-03 (×2): 40 meq via ORAL
  Filled 2021-06-03 (×2): qty 2

## 2021-06-03 MED ORDER — FREE WATER
250.0000 mL | Status: DC
Start: 2021-06-03 — End: 2021-06-05
  Administered 2021-06-03 – 2021-06-05 (×12): 250 mL
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
## 2021-06-03 NOTE — Progress Notes (Signed)
ABG and potassium critical; trauma notified.

## 2021-06-03 NOTE — Progress Notes (Signed)
Lab called with a critical sodium of 164; will notify trauma.
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
## 2021-06-04 LAB — CBC
HCT: 26.1 % — ABNORMAL LOW (ref 39.0–52.0)
Hemoglobin: 7.1 g/dL — ABNORMAL LOW (ref 13.0–17.0)
MCH: 28.2 pg (ref 26.0–34.0)
MCHC: 27.2 g/dL — ABNORMAL LOW (ref 30.0–36.0)
MCV: 103.6 fL — ABNORMAL HIGH (ref 80.0–100.0)
Platelets: 324 10*3/uL (ref 150–400)
RBC: 2.52 MIL/uL — ABNORMAL LOW (ref 4.22–5.81)
RDW: 18.1 % — ABNORMAL HIGH (ref 11.5–15.5)
WBC: 17.8 10*3/uL — ABNORMAL HIGH (ref 4.0–10.5)
nRBC: 2.1 % — ABNORMAL HIGH (ref 0.0–0.2)

## 2021-06-04 LAB — BASIC METABOLIC PANEL
Anion gap: 8 (ref 5–15)
BUN: 55 mg/dL — ABNORMAL HIGH (ref 6–20)
CO2: 37 mmol/L — ABNORMAL HIGH (ref 22–32)
Calcium: 8.8 mg/dL — ABNORMAL LOW (ref 8.9–10.3)
Chloride: 112 mmol/L — ABNORMAL HIGH (ref 98–111)
Creatinine, Ser: 0.81 mg/dL (ref 0.61–1.24)
GFR, Estimated: >60 mL/min (ref >60)
Glucose, Bld: 150 mg/dL — ABNORMAL HIGH (ref 70–99)
Potassium: 3.7 mmol/L (ref 3.5–5.1)
Sodium: 157 mmol/L — ABNORMAL HIGH (ref 135–145)

## 2021-06-04 LAB — GLUCOSE, CAPILLARY
Glucose-Capillary: 156 mg/dL — ABNORMAL HIGH (ref 70–99)
Glucose-Capillary: 149 mg/dL — ABNORMAL HIGH (ref 70–99)
Glucose-Capillary: 155 mg/dL — ABNORMAL HIGH (ref 70–99)
Glucose-Capillary: 139 mg/dL — ABNORMAL HIGH (ref 70–99)
Glucose-Capillary: 146 mg/dL — ABNORMAL HIGH (ref 70–99)
Glucose-Capillary: 150 mg/dL — ABNORMAL HIGH (ref 70–99)

## 2021-06-04 LAB — VANCOMYCIN, PEAK: Vancomycin Pk: 21 ug/mL — ABNORMAL LOW (ref 30–40)

## 2021-06-04 MED ORDER — STERILE WATER FOR INJECTION IJ SOLN
INTRAMUSCULAR | Status: AC
  Administered 2021-06-04: 5 mL
  Filled 2021-06-04: qty 10
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
## 2021-06-05 ENCOUNTER — Inpatient Hospital Stay (HOSPITAL_COMMUNITY): Payer: Medicaid Other

## 2021-06-05 LAB — GLUCOSE, CAPILLARY
Glucose-Capillary: 128 mg/dL — ABNORMAL HIGH (ref 70–99)
Glucose-Capillary: 143 mg/dL — ABNORMAL HIGH (ref 70–99)
Glucose-Capillary: 148 mg/dL — ABNORMAL HIGH (ref 70–99)
Glucose-Capillary: 158 mg/dL — ABNORMAL HIGH (ref 70–99)
Glucose-Capillary: 160 mg/dL — ABNORMAL HIGH (ref 70–99)
Glucose-Capillary: 160 mg/dL — ABNORMAL HIGH (ref 70–99)

## 2021-06-05 LAB — POCT I-STAT 7, (LYTES, BLD GAS, ICA,H+H)
Acid-Base Excess: 7 mmol/L — ABNORMAL HIGH (ref 0.0–2.0)
Bicarbonate: 34.3 mmol/L — ABNORMAL HIGH (ref 20.0–28.0)
Calcium, Ion: 1.25 mmol/L (ref 1.15–1.40)
HCT: 42 % (ref 39.0–52.0)
Hemoglobin: 14.3 g/dL (ref 13.0–17.0)
O2 Saturation: 89 %
Patient temperature: 100.4
Potassium: 3.5 mmol/L (ref 3.5–5.1)
Sodium: 148 mmol/L — ABNORMAL HIGH (ref 135–145)
TCO2: 36 mmol/L — ABNORMAL HIGH (ref 22–32)
pCO2 arterial: 59.8 mmHg — ABNORMAL HIGH (ref 32–48)
pH, Arterial: 7.371 (ref 7.35–7.45)
pO2, Arterial: 64 mmHg — ABNORMAL LOW (ref 83–108)

## 2021-06-05 LAB — CBC
HCT: 27.1 % — ABNORMAL LOW (ref 39.0–52.0)
Hemoglobin: 7.7 g/dL — ABNORMAL LOW (ref 13.0–17.0)
MCH: 28.7 pg (ref 26.0–34.0)
MCHC: 28.4 g/dL — ABNORMAL LOW (ref 30.0–36.0)
MCV: 101.1 fL — ABNORMAL HIGH (ref 80.0–100.0)
Platelets: 367 10*3/uL (ref 150–400)
RBC: 2.68 MIL/uL — ABNORMAL LOW (ref 4.22–5.81)
RDW: 17.4 % — ABNORMAL HIGH (ref 11.5–15.5)
WBC: 18.5 10*3/uL — ABNORMAL HIGH (ref 4.0–10.5)
nRBC: 3.6 % — ABNORMAL HIGH (ref 0.0–0.2)

## 2021-06-05 LAB — BASIC METABOLIC PANEL
Anion gap: 8 (ref 5–15)
BUN: 42 mg/dL — ABNORMAL HIGH (ref 6–20)
CO2: 32 mmol/L (ref 22–32)
Calcium: 9.1 mg/dL (ref 8.9–10.3)
Chloride: 106 mmol/L (ref 98–111)
Creatinine, Ser: 0.64 mg/dL (ref 0.61–1.24)
GFR, Estimated: >60 mL/min (ref >60)
Glucose, Bld: 140 mg/dL — ABNORMAL HIGH (ref 70–99)
Potassium: 3.3 mmol/L — ABNORMAL LOW (ref 3.5–5.1)
Sodium: 146 mmol/L — ABNORMAL HIGH (ref 135–145)

## 2021-06-05 LAB — VANCOMYCIN, TROUGH: Vancomycin Tr: 9 ug/mL — ABNORMAL LOW (ref 15–20)

## 2021-06-05 MED ORDER — VANCOMYCIN HCL 750 MG/150ML IV SOLN
750.0000 mg | Freq: Two times a day (BID) | INTRAVENOUS | Status: AC
  Administered 2021-06-05 – 2021-06-12 (×16): 750 mg via INTRAVENOUS
  Filled 2021-06-05 (×16): qty 150

## 2021-06-05 MED ORDER — MIDAZOLAM HCL 2 MG/2ML IJ SOLN
1.0000 mg | INTRAMUSCULAR | Status: DC | PRN
  Administered 2021-06-05 (×2): 2 mg via INTRAVENOUS
  Administered 2021-06-05: 1 mg via INTRAVENOUS
  Filled 2021-06-05 (×2): qty 2

## 2021-06-05 MED ORDER — MIDAZOLAM HCL 2 MG/2ML IJ SOLN
INTRAMUSCULAR | Status: AC
  Filled 2021-06-05: qty 2

## 2021-06-05 MED ORDER — POTASSIUM CHLORIDE 20 MEQ PO PACK
40.0000 meq | PACK | Freq: Two times a day (BID) | ORAL | Status: DC
  Administered 2021-06-05: 40 meq via ORAL
  Filled 2021-06-05 (×2): qty 2

## 2021-06-05 MED ORDER — ACETAZOLAMIDE SODIUM 500 MG IJ SOLR
500.0000 mg | Freq: Every day | INTRAMUSCULAR | Status: DC
Start: 2021-06-06 — End: 2021-06-09
  Administered 2021-06-06 – 2021-06-09 (×4): 500 mg via INTRAVENOUS
  Filled 2021-06-05 (×4): qty 500

## 2021-06-05 MED ORDER — FENTANYL CITRATE PF 50 MCG/ML IJ SOSY
50.0000 ug | PREFILLED_SYRINGE | INTRAMUSCULAR | Status: DC | PRN
  Administered 2021-06-05 (×2): 50 ug via INTRAVENOUS
  Filled 2021-06-05 (×2): qty 1

## 2021-06-05 MED ORDER — POTASSIUM CHLORIDE 20 MEQ PO PACK
40.0000 meq | PACK | Freq: Two times a day (BID) | ORAL | Status: DC
  Administered 2021-06-06 – 2021-06-09 (×8): 40 meq
  Filled 2021-06-05 (×7): qty 2

## 2021-06-05 MED ORDER — METOLAZONE 5 MG PO TABS
10.0000 mg | ORAL_TABLET | Freq: Every day | ORAL | Status: DC
Start: 2021-06-06 — End: 2021-06-09
  Administered 2021-06-06 – 2021-06-09 (×4): 10 mg
  Filled 2021-06-05 (×4): qty 2

## 2021-06-05 NOTE — Progress Notes (Signed)
Pt RR sustaining in 40's HR 120's. O2 87-89. RN called RT they suggested it was a sedation issue. Dr. Kieth Brightly notified for PRN pushes. Order for versed entered and given. Will continue to monitor.
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
## 2021-06-05 NOTE — Progress Notes (Signed)
Tachypneic today. Increased PEEP. Gave PRN versed and PRN fentanyl without improvement. XR with lower right pneumothorax. Opened clamped chest tube with 150-200 ml/breath air leak without improvement in vitals. Reclamped chest tube for non-improvement. Will restart dilaudid drip

## 2021-06-05 NOTE — Progress Notes (Signed)
RT note-RN notified of RR in the mid 40's, Dr. Sheliah Hatch at the bedside at this time, Fio2 increased to 60% and PEEP back at 8, Sp02 now 90-91%, continue to monitor.
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
## 2021-06-05 NOTE — Progress Notes (Signed)
Pt resp rate continues to sustain in the 40's and O2 in the high 80's despite vent adjustments and numerous doses of versed. Dr. Sheliah Hatch paged and arrived to room. Chest xray and abg ordered. Both reviewed by MD. Chest tube unclamped with MD at bedside. No significant changes noted. Orders for fentanyl pushes given. No changes after fentanyl given. Orders then given to restart dilaudid drip. Radiology then called RN to report critical results of chest xray. Dr. Sheliah Hatch notified. No new orders given.

## 2021-06-05 NOTE — Progress Notes (Addendum)
Dr. Kieth Brightly is aware of ABG results. No new orders were given.

## 2021-06-06 ENCOUNTER — Inpatient Hospital Stay (HOSPITAL_COMMUNITY): Payer: Medicaid Other

## 2021-06-06 LAB — BASIC METABOLIC PANEL
Anion gap: 7 (ref 5–15)
BUN: 55 mg/dL — ABNORMAL HIGH (ref 6–20)
CO2: 33 mmol/L — ABNORMAL HIGH (ref 22–32)
Calcium: 8.8 mg/dL — ABNORMAL LOW (ref 8.9–10.3)
Chloride: 109 mmol/L (ref 98–111)
Creatinine, Ser: 0.8 mg/dL (ref 0.61–1.24)
GFR, Estimated: >60 mL/min (ref >60)
Glucose, Bld: 161 mg/dL — ABNORMAL HIGH (ref 70–99)
Potassium: 4 mmol/L (ref 3.5–5.1)
Sodium: 149 mmol/L — ABNORMAL HIGH (ref 135–145)

## 2021-06-06 LAB — POCT I-STAT 7, (LYTES, BLD GAS, ICA,H+H)
Acid-Base Excess: 10 mmol/L — ABNORMAL HIGH (ref 0.0–2.0)
Bicarbonate: 37.7 mmol/L — ABNORMAL HIGH (ref 20.0–28.0)
Calcium, Ion: 1.26 mmol/L (ref 1.15–1.40)
HCT: 26 % — ABNORMAL LOW (ref 39.0–52.0)
Hemoglobin: 8.8 g/dL — ABNORMAL LOW (ref 13.0–17.0)
O2 Saturation: 89 %
Patient temperature: 101.7
Potassium: 3.7 mmol/L (ref 3.5–5.1)
Sodium: 151 mmol/L — ABNORMAL HIGH (ref 135–145)
TCO2: 40 mmol/L — ABNORMAL HIGH (ref 22–32)
pCO2 arterial: 74.6 mmHg (ref 32–48)
pH, Arterial: 7.32 — ABNORMAL LOW (ref 7.35–7.45)
pO2, Arterial: 69 mmHg — ABNORMAL LOW (ref 83–108)

## 2021-06-06 LAB — CBC
HCT: 25.8 % — ABNORMAL LOW (ref 39.0–52.0)
Hemoglobin: 7.4 g/dL — ABNORMAL LOW (ref 13.0–17.0)
MCH: 29.5 pg (ref 26.0–34.0)
MCHC: 28.7 g/dL — ABNORMAL LOW (ref 30.0–36.0)
MCV: 102.8 fL — ABNORMAL HIGH (ref 80.0–100.0)
Platelets: 290 10*3/uL (ref 150–400)
RBC: 2.51 MIL/uL — ABNORMAL LOW (ref 4.22–5.81)
RDW: 17.6 % — ABNORMAL HIGH (ref 11.5–15.5)
WBC: 22.5 10*3/uL — ABNORMAL HIGH (ref 4.0–10.5)
nRBC: 2.1 % — ABNORMAL HIGH (ref 0.0–0.2)

## 2021-06-06 LAB — GLUCOSE, CAPILLARY
Glucose-Capillary: 139 mg/dL — ABNORMAL HIGH (ref 70–99)
Glucose-Capillary: 137 mg/dL — ABNORMAL HIGH (ref 70–99)
Glucose-Capillary: 139 mg/dL — ABNORMAL HIGH (ref 70–99)
Glucose-Capillary: 147 mg/dL — ABNORMAL HIGH (ref 70–99)
Glucose-Capillary: 136 mg/dL — ABNORMAL HIGH (ref 70–99)
Glucose-Capillary: 144 mg/dL — ABNORMAL HIGH (ref 70–99)

## 2021-06-06 LAB — PATHOLOGIST SMEAR REVIEW

## 2021-06-06 MED ORDER — SODIUM CHLORIDE 0.9 % IV SOLN: INTRAVENOUS | Status: DC | PRN

## 2021-06-06 MED ORDER — ENOXAPARIN SODIUM 80 MG/0.8ML IJ SOSY
75.0000 mg | PREFILLED_SYRINGE | Freq: Two times a day (BID) | INTRAMUSCULAR | Status: DC
  Administered 2021-06-06 – 2021-06-15 (×18): 75 mg via SUBCUTANEOUS
  Filled 2021-06-06 (×19): qty 0.75

## 2021-06-06 MED ORDER — DIAZEPAM 5 MG PO TABS
12.5000 mg | ORAL_TABLET | Freq: Four times a day (QID) | ORAL | Status: DC
  Administered 2021-06-06 – 2021-06-15 (×36): 12.5 mg
  Filled 2021-06-06 (×37): qty 3

## 2021-06-06 MED ORDER — STERILE WATER FOR INJECTION IJ SOLN
INTRAMUSCULAR | Status: AC
  Filled 2021-06-06: qty 10

## 2021-06-06 NOTE — Progress Notes (Signed)
RTx2 attempted aline insertion x3 without success. RT made MD aware. RT to obtain ABG per MD. RT will attempt ABG and continue to monitor.
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
## 2021-06-06 NOTE — Progress Notes (Signed)
MRI unavailable at this time.

## 2021-06-06 NOTE — Progress Notes (Addendum)
Patient has had increased oxygenation needs throughout the night. Patient has copious amounts of tan/yellow secretions. Patient is currently on 80% and saturations have been borderline acceptable, despite interventions of pulmonary toilet and suctioning. Considering the pathology of the development of large right inferior hydropneumothorax per recent CXR, that is potentially causing his worsening oxygenation requirements.  Bite block in place due to patient aggressively clamping down on the ETT.   Nathan West, BS, RRT-ACCS, RCP

## 2021-06-07 ENCOUNTER — Inpatient Hospital Stay (HOSPITAL_COMMUNITY): Payer: Medicaid Other

## 2021-06-07 LAB — GLUCOSE, CAPILLARY
Glucose-Capillary: 142 mg/dL — ABNORMAL HIGH (ref 70–99)
Glucose-Capillary: 164 mg/dL — ABNORMAL HIGH (ref 70–99)
Glucose-Capillary: 165 mg/dL — ABNORMAL HIGH (ref 70–99)
Glucose-Capillary: 106 mg/dL — ABNORMAL HIGH (ref 70–99)
Glucose-Capillary: 120 mg/dL — ABNORMAL HIGH (ref 70–99)
Glucose-Capillary: 134 mg/dL — ABNORMAL HIGH (ref 70–99)

## 2021-06-07 LAB — CBC
HCT: 25.5 % — ABNORMAL LOW (ref 39.0–52.0)
Hemoglobin: 7.1 g/dL — ABNORMAL LOW (ref 13.0–17.0)
MCH: 28.4 pg (ref 26.0–34.0)
MCHC: 27.8 g/dL — ABNORMAL LOW (ref 30.0–36.0)
MCV: 102 fL — ABNORMAL HIGH (ref 80.0–100.0)
Platelets: 289 10*3/uL (ref 150–400)
RBC: 2.5 MIL/uL — ABNORMAL LOW (ref 4.22–5.81)
RDW: 17.9 % — ABNORMAL HIGH (ref 11.5–15.5)
WBC: 21.2 10*3/uL — ABNORMAL HIGH (ref 4.0–10.5)
nRBC: 1.1 % — ABNORMAL HIGH (ref 0.0–0.2)

## 2021-06-07 LAB — POCT I-STAT 7, (LYTES, BLD GAS, ICA,H+H)
Acid-Base Excess: 11 mmol/L — ABNORMAL HIGH (ref 0.0–2.0)
Bicarbonate: 39 mmol/L — ABNORMAL HIGH (ref 20.0–28.0)
Calcium, Ion: 1.27 mmol/L (ref 1.15–1.40)
HCT: 26 % — ABNORMAL LOW (ref 39.0–52.0)
Hemoglobin: 8.8 g/dL — ABNORMAL LOW (ref 13.0–17.0)
O2 Saturation: 92 %
Patient temperature: 97.8
Potassium: 3.8 mmol/L (ref 3.5–5.1)
Sodium: 153 mmol/L — ABNORMAL HIGH (ref 135–145)
TCO2: 41 mmol/L — ABNORMAL HIGH (ref 22–32)
pCO2 arterial: 71.5 mmHg (ref 32–48)
pH, Arterial: 7.343 — ABNORMAL LOW (ref 7.35–7.45)
pO2, Arterial: 70 mmHg — ABNORMAL LOW (ref 83–108)

## 2021-06-07 LAB — BASIC METABOLIC PANEL
Anion gap: 5 (ref 5–15)
BUN: 46 mg/dL — ABNORMAL HIGH (ref 6–20)
CO2: 35 mmol/L — ABNORMAL HIGH (ref 22–32)
Calcium: 9 mg/dL (ref 8.9–10.3)
Chloride: 110 mmol/L (ref 98–111)
Creatinine, Ser: 0.65 mg/dL (ref 0.61–1.24)
GFR, Estimated: >60 mL/min (ref >60)
Glucose, Bld: 135 mg/dL — ABNORMAL HIGH (ref 70–99)
Potassium: 3.8 mmol/L (ref 3.5–5.1)
Sodium: 150 mmol/L — ABNORMAL HIGH (ref 135–145)

## 2021-06-07 LAB — VANCOMYCIN, PEAK: Vancomycin Pk: 31 ug/mL (ref 30–40)

## 2021-06-07 LAB — VANCOMYCIN, TROUGH: Vancomycin Tr: 13 ug/mL — ABNORMAL LOW (ref 15–20)

## 2021-06-07 MED ORDER — STERILE WATER FOR INJECTION IJ SOLN
INTRAMUSCULAR | Status: AC
  Administered 2021-06-07: 10 mL
  Filled 2021-06-07: qty 10

## 2021-06-07 MED ORDER — FUROSEMIDE 10 MG/ML IJ SOLN
40.0000 mg | Freq: Once | INTRAMUSCULAR | Status: AC
  Administered 2021-06-07: 40 mg via INTRAVENOUS
  Filled 2021-06-07: qty 4

## 2021-06-07 MED ORDER — PIVOT 1.5 CAL PO LIQD
1000.0000 mL | ORAL | Status: DC
  Administered 2021-06-07 – 2021-06-15 (×10): 1000 mL

## 2021-06-07 MED ORDER — POTASSIUM CHLORIDE 20 MEQ PO PACK
40.0000 meq | PACK | Freq: Once | ORAL | Status: DC
Start: 2021-06-07 — End: 2021-06-07

## 2021-06-07 NOTE — Progress Notes (Signed)
RT transported patient to CT and back with RN. No complications. RT will continue to monitor.  ?

## 2021-06-07 NOTE — Progress Notes (Signed)
   Subjective/Interval: Patient remains intubated and sedated. Pending brain MRI today for further assessment/prognostication.  Objective:   VITALS:   Vitals:   06/07/21 0900 06/07/21 1000 06/07/21 1100 06/07/21 1101  BP: 112/64 113/64 109/60 109/60  Pulse: (!) 103 (!) 112 (!) 115 (!) 116  Resp: (!) 30 (!) 29 (!) 28 (!) 42  Temp:      TempSrc:      SpO2: 94% 94% 94%   Weight:      Height:       Incision visualized is  well healed, no erythema or drainage. 2+ radial pulse.  Fingers warm and well-perfused. Compartments are compressible dorsally and volarly.   Lab Results  Component Value Date   WBC 21.2 (H) 06/07/2021   HGB 8.8 (L) 06/07/2021   HCT 26.0 (L) 06/07/2021   MCV 102.0 (H) 06/07/2021   PLT 289 06/07/2021     Assessment/Plan: Status post left both bone forearm fracture fixation with fasciotomy closure, removal of Ex-Fix, radial nerve exploration 4/26, sutures removed 5/12  - Patient to continue to work with occupational therapy when he is able - DVT ppx - SCDs, ambulation, Lovenox  - Activity as tolerated left arm  -Orthopedics will continue to follow  Callie Facey 06/07/2021, 12:07 PM

## 2021-06-07 NOTE — Progress Notes (Signed)
Family meeting held with patient's mother and uncle. Clinical course and expectations explained. Counseled that patient will need trach and PEG given prolonged illness and recs for SNF/LTAC. Advised on geographic limitations of facilities with capability to care for him. Discussed the management of his BPF to potentially include placement of an endobronchial valve if trach/PEG is desired. 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 Advised on what a transition to comfort care would entail. Discussion ended with a decision to perform neurologic imaging to aid family in their decision-making, although hypercarbia may be playing a role in his inability to follow commands.   Additional critical care time:  Diamantina Monks, MD General and Trauma Surgery [facility]
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
## 2021-06-08 LAB — BASIC METABOLIC PANEL
Anion gap: 8 (ref 5–15)
BUN: 51 mg/dL — ABNORMAL HIGH (ref 6–20)
CO2: 35 mmol/L — ABNORMAL HIGH (ref 22–32)
Calcium: 9 mg/dL (ref 8.9–10.3)
Chloride: 111 mmol/L (ref 98–111)
Creatinine, Ser: 0.64 mg/dL (ref 0.61–1.24)
GFR, Estimated: >60 mL/min (ref >60)
Glucose, Bld: 126 mg/dL — ABNORMAL HIGH (ref 70–99)
Potassium: 4.5 mmol/L (ref 3.5–5.1)
Sodium: 154 mmol/L — ABNORMAL HIGH (ref 135–145)

## 2021-06-08 LAB — POCT I-STAT 7, (LYTES, BLD GAS, ICA,H+H)
Acid-Base Excess: 12 mmol/L — ABNORMAL HIGH (ref 0.0–2.0)
Bicarbonate: 39.4 mmol/L — ABNORMAL HIGH (ref 20.0–28.0)
Calcium, Ion: 1.25 mmol/L (ref 1.15–1.40)
HCT: 26 % — ABNORMAL LOW (ref 39.0–52.0)
Hemoglobin: 8.8 g/dL — ABNORMAL LOW (ref 13.0–17.0)
O2 Saturation: 95 %
Patient temperature: 100.4
Potassium: 4.1 mmol/L (ref 3.5–5.1)
Sodium: 156 mmol/L — ABNORMAL HIGH (ref 135–145)
TCO2: 42 mmol/L — ABNORMAL HIGH (ref 22–32)
pCO2 arterial: 73.4 mmHg (ref 32–48)
pH, Arterial: 7.342 — ABNORMAL LOW (ref 7.35–7.45)
pO2, Arterial: 88 mmHg (ref 83–108)

## 2021-06-08 LAB — GLUCOSE, CAPILLARY
Glucose-Capillary: 137 mg/dL — ABNORMAL HIGH (ref 70–99)
Glucose-Capillary: 144 mg/dL — ABNORMAL HIGH (ref 70–99)
Glucose-Capillary: 140 mg/dL — ABNORMAL HIGH (ref 70–99)
Glucose-Capillary: 142 mg/dL — ABNORMAL HIGH (ref 70–99)
Glucose-Capillary: 140 mg/dL — ABNORMAL HIGH (ref 70–99)
Glucose-Capillary: 129 mg/dL — ABNORMAL HIGH (ref 70–99)

## 2021-06-08 LAB — CBC
HCT: 25.4 % — ABNORMAL LOW (ref 39.0–52.0)
Hemoglobin: 7.2 g/dL — ABNORMAL LOW (ref 13.0–17.0)
MCH: 28.9 pg (ref 26.0–34.0)
MCHC: 28.3 g/dL — ABNORMAL LOW (ref 30.0–36.0)
MCV: 102 fL — ABNORMAL HIGH (ref 80.0–100.0)
Platelets: 312 10*3/uL (ref 150–400)
RBC: 2.49 MIL/uL — ABNORMAL LOW (ref 4.22–5.81)
RDW: 17.9 % — ABNORMAL HIGH (ref 11.5–15.5)
WBC: 18 10*3/uL — ABNORMAL HIGH (ref 4.0–10.5)
nRBC: 1 % — ABNORMAL HIGH (ref 0.0–0.2)

## 2021-06-08 MED ORDER — STERILE WATER FOR INJECTION IJ SOLN
INTRAMUSCULAR | Status: AC
  Filled 2021-06-08: qty 10

## 2021-06-08 MED ORDER — OXYCODONE HCL 5 MG/5ML PO SOLN
10.0000 mg | Freq: Four times a day (QID) | ORAL | Status: DC
  Administered 2021-06-08 – 2021-06-10 (×6): 10 mg
  Filled 2021-06-08 (×7): qty 10
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
## 2021-06-09 LAB — POCT I-STAT 7, (LYTES, BLD GAS, ICA,H+H)
Acid-Base Excess: 13 mmol/L — ABNORMAL HIGH (ref 0.0–2.0)
Bicarbonate: 40 mmol/L — ABNORMAL HIGH (ref 20.0–28.0)
Calcium, Ion: 1.26 mmol/L (ref 1.15–1.40)
HCT: 25 % — ABNORMAL LOW (ref 39.0–52.0)
Hemoglobin: 8.5 g/dL — ABNORMAL LOW (ref 13.0–17.0)
O2 Saturation: 90 %
Patient temperature: 98.4
Potassium: 3.5 mmol/L (ref 3.5–5.1)
Sodium: 158 mmol/L — ABNORMAL HIGH (ref 135–145)
TCO2: 42 mmol/L — ABNORMAL HIGH (ref 22–32)
pCO2 arterial: 65.2 mmHg (ref 32–48)
pH, Arterial: 7.395 (ref 7.35–7.45)
pO2, Arterial: 62 mmHg — ABNORMAL LOW (ref 83–108)

## 2021-06-09 LAB — BASIC METABOLIC PANEL
Anion gap: 11 (ref 5–15)
BUN: 49 mg/dL — ABNORMAL HIGH (ref 6–20)
CO2: 32 mmol/L (ref 22–32)
Calcium: 9.2 mg/dL (ref 8.9–10.3)
Chloride: 113 mmol/L — ABNORMAL HIGH (ref 98–111)
Creatinine, Ser: 0.55 mg/dL — ABNORMAL LOW (ref 0.61–1.24)
GFR, Estimated: >60 mL/min (ref >60)
Glucose, Bld: 140 mg/dL — ABNORMAL HIGH (ref 70–99)
Potassium: 4.1 mmol/L (ref 3.5–5.1)
Sodium: 156 mmol/L — ABNORMAL HIGH (ref 135–145)

## 2021-06-09 LAB — CBC
HCT: 27.9 % — ABNORMAL LOW (ref 39.0–52.0)
Hemoglobin: 7.5 g/dL — ABNORMAL LOW (ref 13.0–17.0)
MCH: 27.9 pg (ref 26.0–34.0)
MCHC: 26.9 g/dL — ABNORMAL LOW (ref 30.0–36.0)
MCV: 103.7 fL — ABNORMAL HIGH (ref 80.0–100.0)
Platelets: 350 10*3/uL (ref 150–400)
RBC: 2.69 MIL/uL — ABNORMAL LOW (ref 4.22–5.81)
RDW: 18.3 % — ABNORMAL HIGH (ref 11.5–15.5)
WBC: 14.9 10*3/uL — ABNORMAL HIGH (ref 4.0–10.5)
nRBC: 0.7 % — ABNORMAL HIGH (ref 0.0–0.2)

## 2021-06-09 LAB — GLUCOSE, CAPILLARY
Glucose-Capillary: 134 mg/dL — ABNORMAL HIGH (ref 70–99)
Glucose-Capillary: 146 mg/dL — ABNORMAL HIGH (ref 70–99)
Glucose-Capillary: 129 mg/dL — ABNORMAL HIGH (ref 70–99)
Glucose-Capillary: 130 mg/dL — ABNORMAL HIGH (ref 70–99)
Glucose-Capillary: 162 mg/dL — ABNORMAL HIGH (ref 70–99)

## 2021-06-09 MED ORDER — AMOXICILLIN-POT CLAVULANATE 875-125 MG PO TABS
1.0000 | ORAL_TABLET | Freq: Two times a day (BID) | ORAL | Status: DC
Start: 2021-06-09 — End: 2021-06-16
  Administered 2021-06-09 – 2021-06-15 (×13): 1
  Filled 2021-06-09 (×14): qty 1

## 2021-06-09 MED ORDER — IBUPROFEN 100 MG/5ML PO SUSP
400.0000 mg | Freq: Three times a day (TID) | ORAL | Status: DC
  Administered 2021-06-09 – 2021-06-10 (×3): 400 mg
  Filled 2021-06-09 (×4): qty 20

## 2021-06-09 MED ORDER — FREE WATER
250.0000 mL | Status: DC
  Administered 2021-06-09 – 2021-06-10 (×6): 250 mL

## 2021-06-09 MED ORDER — METOPROLOL TARTRATE 50 MG PO TABS
50.0000 mg | ORAL_TABLET | Freq: Once | ORAL | Status: AC
  Administered 2021-06-09: 50 mg
  Filled 2021-06-09: qty 1

## 2021-06-09 MED ORDER — METOPROLOL TARTRATE 50 MG PO TABS
100.0000 mg | ORAL_TABLET | Freq: Three times a day (TID) | ORAL | Status: DC
Start: 2021-06-09 — End: 2021-06-10
  Administered 2021-06-09 – 2021-06-10 (×2): 100 mg
  Filled 2021-06-09 (×2): qty 2

## 2021-06-09 NOTE — TOC Progression Note (Signed)
Transition of Care [facility]) - Progression Note    Patient Details  Name: Nathan West MRN: [id number] Date of Birth: [birth year]  [facility] CM/SW Contact  Glennon Mac, RN Phone Number: 06/09/2021, 3:54 PM  Clinical Narrative:    TOC continues to follow as patient progresses.  Noted patient weaning today on ventilator, and bedside nurse states he seems more alert.  Family still considering tracheostomy and PEG placement.   Expected Discharge Plan: Skilled Nursing Facility Barriers to Discharge: Continued Medical Work up  Expected Discharge Plan and Services Expected Discharge Plan: Skilled Nursing Facility   Discharge Planning Services: CM Consult   Living arrangements for the past 2 months: Apartment                                       Social Determinants of Health (SDOH) Interventions    Readmission Risk Interventions     No data to display         Quintella Baton, RN, BSN  Trauma/Neuro ICU Case Manager [phone]
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
## 2021-06-10 LAB — BASIC METABOLIC PANEL
Anion gap: 12 (ref 5–15)
BUN: 58 mg/dL — ABNORMAL HIGH (ref 6–20)
CO2: 31 mmol/L (ref 22–32)
Calcium: 9.1 mg/dL (ref 8.9–10.3)
Chloride: 111 mmol/L (ref 98–111)
Creatinine, Ser: 0.69 mg/dL (ref 0.61–1.24)
GFR, Estimated: >60 mL/min (ref >60)
Glucose, Bld: 139 mg/dL — ABNORMAL HIGH (ref 70–99)
Potassium: 3.9 mmol/L (ref 3.5–5.1)
Sodium: 154 mmol/L — ABNORMAL HIGH (ref 135–145)

## 2021-06-10 LAB — BLOOD GAS, ARTERIAL
Acid-Base Excess: 10.5 mmol/L — ABNORMAL HIGH (ref 0.0–2.0)
Bicarbonate: 38.1 mmol/L — ABNORMAL HIGH (ref 20.0–28.0)
Drawn by: 38235
O2 Saturation: 93.3 %
Patient temperature: 38.7
pCO2 arterial: 68 mmHg (ref 32–48)
pH, Arterial: 7.37 (ref 7.35–7.45)
pO2, Arterial: 73 mmHg — ABNORMAL LOW (ref 83–108)

## 2021-06-10 LAB — CBC
HCT: 28.3 % — ABNORMAL LOW (ref 39.0–52.0)
Hemoglobin: 8.1 g/dL — ABNORMAL LOW (ref 13.0–17.0)
MCH: 29.2 pg (ref 26.0–34.0)
MCHC: 28.6 g/dL — ABNORMAL LOW (ref 30.0–36.0)
MCV: 102.2 fL — ABNORMAL HIGH (ref 80.0–100.0)
Platelets: 364 10*3/uL (ref 150–400)
RBC: 2.77 MIL/uL — ABNORMAL LOW (ref 4.22–5.81)
RDW: 18.6 % — ABNORMAL HIGH (ref 11.5–15.5)
WBC: 17.8 10*3/uL — ABNORMAL HIGH (ref 4.0–10.5)
nRBC: 0.6 % — ABNORMAL HIGH (ref 0.0–0.2)

## 2021-06-10 LAB — GLUCOSE, CAPILLARY
Glucose-Capillary: 146 mg/dL — ABNORMAL HIGH (ref 70–99)
Glucose-Capillary: 138 mg/dL — ABNORMAL HIGH (ref 70–99)
Glucose-Capillary: 135 mg/dL — ABNORMAL HIGH (ref 70–99)
Glucose-Capillary: 129 mg/dL — ABNORMAL HIGH (ref 70–99)
Glucose-Capillary: 142 mg/dL — ABNORMAL HIGH (ref 70–99)
Glucose-Capillary: 158 mg/dL — ABNORMAL HIGH (ref 70–99)

## 2021-06-10 MED ORDER — METOPROLOL TARTRATE 50 MG PO TABS
150.0000 mg | ORAL_TABLET | Freq: Two times a day (BID) | ORAL | Status: DC
  Administered 2021-06-10 – 2021-06-15 (×10): 150 mg
  Filled 2021-06-10 (×10): qty 3

## 2021-06-10 MED ORDER — GABAPENTIN 600 MG PO TABS
300.0000 mg | ORAL_TABLET | Freq: Two times a day (BID) | ORAL | Status: DC
  Administered 2021-06-10 (×2): 300 mg
  Filled 2021-06-10 (×3): qty 0.5

## 2021-06-10 MED ORDER — FREE WATER
300.0000 mL | Status: DC
  Administered 2021-06-10 – 2021-06-11 (×8): 300 mL

## 2021-06-10 MED ORDER — OXYCODONE HCL 5 MG PO TABS
10.0000 mg | ORAL_TABLET | Freq: Four times a day (QID) | ORAL | Status: DC
  Administered 2021-06-10 – 2021-06-15 (×21): 10 mg
  Filled 2021-06-10 (×21): qty 2

## 2021-06-10 MED ORDER — METOPROLOL TARTRATE 50 MG PO TABS
50.0000 mg | ORAL_TABLET | ORAL | Status: AC
Start: 2021-06-10 — End: 2021-06-10
  Administered 2021-06-10: 50 mg
  Filled 2021-06-10: qty 1

## 2021-06-10 MED ORDER — IBUPROFEN 200 MG PO TABS
400.0000 mg | ORAL_TABLET | Freq: Three times a day (TID) | ORAL | Status: AC
  Administered 2021-06-10 – 2021-06-12 (×6): 400 mg
  Filled 2021-06-10 (×6): qty 2

## 2021-06-10 NOTE — Progress Notes (Signed)
Patient with increased RR and desatting. Switched back over to full vent support, taken off wean. Returned to previous settings but had to increase FiO2 and peep (45% +6) to get O2 sats up. RN made aware
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
## 2021-06-11 LAB — GLUCOSE, CAPILLARY
Glucose-Capillary: 153 mg/dL — ABNORMAL HIGH (ref 70–99)
Glucose-Capillary: 142 mg/dL — ABNORMAL HIGH (ref 70–99)
Glucose-Capillary: 144 mg/dL — ABNORMAL HIGH (ref 70–99)
Glucose-Capillary: 134 mg/dL — ABNORMAL HIGH (ref 70–99)
Glucose-Capillary: 148 mg/dL — ABNORMAL HIGH (ref 70–99)

## 2021-06-11 LAB — BASIC METABOLIC PANEL
Anion gap: 13 (ref 5–15)
BUN: 66 mg/dL — ABNORMAL HIGH (ref 6–20)
CO2: 35 mmol/L — ABNORMAL HIGH (ref 22–32)
Calcium: 8.9 mg/dL (ref 8.9–10.3)
Chloride: 106 mmol/L (ref 98–111)
Creatinine, Ser: 0.92 mg/dL (ref 0.61–1.24)
GFR, Estimated: >60 mL/min (ref >60)
Glucose, Bld: 153 mg/dL — ABNORMAL HIGH (ref 70–99)
Potassium: 4 mmol/L (ref 3.5–5.1)
Sodium: 154 mmol/L — ABNORMAL HIGH (ref 135–145)

## 2021-06-11 LAB — CBC
HCT: 28.8 % — ABNORMAL LOW (ref 39.0–52.0)
Hemoglobin: 8.1 g/dL — ABNORMAL LOW (ref 13.0–17.0)
MCH: 28.6 pg (ref 26.0–34.0)
MCHC: 28.1 g/dL — ABNORMAL LOW (ref 30.0–36.0)
MCV: 101.8 fL — ABNORMAL HIGH (ref 80.0–100.0)
Platelets: 342 10*3/uL (ref 150–400)
RBC: 2.83 MIL/uL — ABNORMAL LOW (ref 4.22–5.81)
RDW: 18.5 % — ABNORMAL HIGH (ref 11.5–15.5)
WBC: 23.6 10*3/uL — ABNORMAL HIGH (ref 4.0–10.5)
nRBC: 0.3 % — ABNORMAL HIGH (ref 0.0–0.2)

## 2021-06-11 MED ORDER — FREE WATER
350.0000 mL | Status: DC
Start: 2021-06-11 — End: 2021-06-13
  Administered 2021-06-11 – 2021-06-13 (×16): 350 mL

## 2021-06-11 MED ORDER — GABAPENTIN 250 MG/5ML PO SOLN
300.0000 mg | Freq: Two times a day (BID) | ORAL | Status: DC
Start: 2021-06-11 — End: 2021-06-13
  Administered 2021-06-11 – 2021-06-12 (×4): 300 mg via ORAL
  Filled 2021-06-11 (×5): qty 6
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
## 2021-06-12 LAB — GLUCOSE, CAPILLARY
Glucose-Capillary: 122 mg/dL — ABNORMAL HIGH (ref 70–99)
Glucose-Capillary: 123 mg/dL — ABNORMAL HIGH (ref 70–99)
Glucose-Capillary: 124 mg/dL — ABNORMAL HIGH (ref 70–99)
Glucose-Capillary: 127 mg/dL — ABNORMAL HIGH (ref 70–99)
Glucose-Capillary: 139 mg/dL — ABNORMAL HIGH (ref 70–99)
Glucose-Capillary: 149 mg/dL — ABNORMAL HIGH (ref 70–99)
Glucose-Capillary: 152 mg/dL — ABNORMAL HIGH (ref 70–99)
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
## 2021-06-13 LAB — GLUCOSE, CAPILLARY
Glucose-Capillary: 112 mg/dL — ABNORMAL HIGH (ref 70–99)
Glucose-Capillary: 120 mg/dL — ABNORMAL HIGH (ref 70–99)
Glucose-Capillary: 120 mg/dL — ABNORMAL HIGH (ref 70–99)
Glucose-Capillary: 121 mg/dL — ABNORMAL HIGH (ref 70–99)
Glucose-Capillary: 126 mg/dL — ABNORMAL HIGH (ref 70–99)
Glucose-Capillary: 147 mg/dL — ABNORMAL HIGH (ref 70–99)

## 2021-06-13 LAB — COMPREHENSIVE METABOLIC PANEL
ALT: 206 U/L — ABNORMAL HIGH (ref 0–44)
AST: 111 U/L — ABNORMAL HIGH (ref 15–41)
Albumin: 2 g/dL — ABNORMAL LOW (ref 3.5–5.0)
Alkaline Phosphatase: 123 U/L (ref 38–126)
Anion gap: 10 (ref 5–15)
BUN: 62 mg/dL — ABNORMAL HIGH (ref 6–20)
CO2: 38 mmol/L — ABNORMAL HIGH (ref 22–32)
Calcium: 8.9 mg/dL (ref 8.9–10.3)
Chloride: 102 mmol/L (ref 98–111)
Creatinine, Ser: 0.58 mg/dL — ABNORMAL LOW (ref 0.61–1.24)
GFR, Estimated: >60 mL/min (ref >60)
Glucose, Bld: 141 mg/dL — ABNORMAL HIGH (ref 70–99)
Potassium: 3.3 mmol/L — ABNORMAL LOW (ref 3.5–5.1)
Sodium: 150 mmol/L — ABNORMAL HIGH (ref 135–145)
Total Bilirubin: 0.5 mg/dL (ref 0.3–1.2)
Total Protein: 8.2 g/dL — ABNORMAL HIGH (ref 6.5–8.1)

## 2021-06-13 LAB — CBC
HCT: 26.1 % — ABNORMAL LOW (ref 39.0–52.0)
Hemoglobin: 7.5 g/dL — ABNORMAL LOW (ref 13.0–17.0)
MCH: 28.6 pg (ref 26.0–34.0)
MCHC: 28.7 g/dL — ABNORMAL LOW (ref 30.0–36.0)
MCV: 99.6 fL (ref 80.0–100.0)
Platelets: 291 10*3/uL (ref 150–400)
RBC: 2.62 MIL/uL — ABNORMAL LOW (ref 4.22–5.81)
RDW: 19.1 % — ABNORMAL HIGH (ref 11.5–15.5)
WBC: 14.2 10*3/uL — ABNORMAL HIGH (ref 4.0–10.5)
nRBC: 0.2 % (ref 0.0–0.2)

## 2021-06-13 MED ORDER — FREE WATER
400.0000 mL | Status: DC
Start: 2021-06-13 — End: 2021-06-15
  Administered 2021-06-13 – 2021-06-15 (×18): 400 mL

## 2021-06-13 MED ORDER — POTASSIUM CHLORIDE 20 MEQ PO PACK
40.0000 meq | PACK | Freq: Once | ORAL | Status: AC
Start: 2021-06-13 — End: 2021-06-13
  Administered 2021-06-13: 40 meq
  Filled 2021-06-13: qty 2

## 2021-06-13 MED ORDER — PANTOPRAZOLE 2 MG/ML SUSPENSION
40.0000 mg | Freq: Every day | ORAL | Status: DC
Start: 2021-06-13 — End: 2021-06-16
  Administered 2021-06-13 – 2021-06-15 (×3): 40 mg
  Filled 2021-06-13 (×3): qty 20

## 2021-06-13 MED ORDER — GABAPENTIN 250 MG/5ML PO SOLN
300.0000 mg | Freq: Two times a day (BID) | ORAL | Status: DC
  Administered 2021-06-13 – 2021-06-15 (×5): 300 mg
  Filled 2021-06-13 (×7): qty 6
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
## 2021-06-13 NOTE — Progress Notes (Signed)
   Subjective/Interval: Patient remains minimally interactive when weaned off of sedation.  Continues pulmonary resuscitation in the ICU  Objective:   VITALS:   Vitals:   06/13/21 0331 06/13/21 0400 06/13/21 0500 06/13/21 0600  BP:  124/61 119/70 124/74  Pulse: (!) 106 (!) 107 (!) 109 (!) 106  Resp: (!) 36 (!) 33 (!) 35 (!) 36  Temp:  98.8 F (37.1 C)    TempSrc:  Axillary    SpO2: 90% 90% 90% 94%  Weight:      Height:       Incision visualized is  well healed, no erythema or drainage. 2+ radial pulse.  Fingers warm and well-perfused. Compartments are compressible dorsally and volarly.   Lab Results  Component Value Date   WBC 14.2 (H) 06/13/2021   HGB 7.5 (L) 06/13/2021   HCT 26.1 (L) 06/13/2021   MCV 99.6 06/13/2021   PLT 291 06/13/2021     Assessment/Plan: Status post left both bone forearm fracture fixation with fasciotomy closure, removal of Ex-Fix, radial nerve exploration 4/26, sutures removed 5/12.  - Patient to continue to work with occupational therapy when he is able  -Orthopedics will continue to follow  Nathan West 06/13/2021, 7:58 AM

## 2021-06-14 LAB — GLUCOSE, CAPILLARY
Glucose-Capillary: 115 mg/dL — ABNORMAL HIGH (ref 70–99)
Glucose-Capillary: 127 mg/dL — ABNORMAL HIGH (ref 70–99)
Glucose-Capillary: 128 mg/dL — ABNORMAL HIGH (ref 70–99)
Glucose-Capillary: 129 mg/dL — ABNORMAL HIGH (ref 70–99)
Glucose-Capillary: 135 mg/dL — ABNORMAL HIGH (ref 70–99)
Glucose-Capillary: 144 mg/dL — ABNORMAL HIGH (ref 70–99)

## 2021-06-14 MED ORDER — JUVEN PO PACK
1.0000 | PACK | Freq: Two times a day (BID) | ORAL | Status: DC
  Administered 2021-06-14 – 2021-06-15 (×3): 1
  Filled 2021-06-14 (×2): qty 1
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
## 2021-06-15 DIAGNOSIS — G9340 Encephalopathy, unspecified: Secondary | ICD-10-CM

## 2021-06-15 DIAGNOSIS — Z66 Do not resuscitate: Secondary | ICD-10-CM

## 2021-06-15 LAB — POCT I-STAT 7, (LYTES, BLD GAS, ICA,H+H)
Acid-Base Excess: 16 mmol/L — ABNORMAL HIGH (ref 0.0–2.0)
Bicarbonate: 40.8 mmol/L — ABNORMAL HIGH (ref 20.0–28.0)
Calcium, Ion: 1.18 mmol/L (ref 1.15–1.40)
HCT: 28 % — ABNORMAL LOW (ref 39.0–52.0)
Hemoglobin: 9.5 g/dL — ABNORMAL LOW (ref 13.0–17.0)
O2 Saturation: 90 %
Patient temperature: 98.3
Potassium: 3.9 mmol/L (ref 3.5–5.1)
Sodium: 140 mmol/L (ref 135–145)
TCO2: 42 mmol/L — ABNORMAL HIGH (ref 22–32)
pCO2 arterial: 52.7 mmHg — ABNORMAL HIGH (ref 32–48)
pH, Arterial: 7.496 — ABNORMAL HIGH (ref 7.35–7.45)
pO2, Arterial: 54 mmHg — ABNORMAL LOW (ref 83–108)

## 2021-06-15 LAB — GLUCOSE, CAPILLARY
Glucose-Capillary: 124 mg/dL — ABNORMAL HIGH (ref 70–99)
Glucose-Capillary: 117 mg/dL — ABNORMAL HIGH (ref 70–99)
Glucose-Capillary: 112 mg/dL — ABNORMAL HIGH (ref 70–99)
Glucose-Capillary: 144 mg/dL — ABNORMAL HIGH (ref 70–99)

## 2021-06-15 LAB — CBC
HCT: 26.5 % — ABNORMAL LOW (ref 39.0–52.0)
Hemoglobin: 7.7 g/dL — ABNORMAL LOW (ref 13.0–17.0)
MCH: 28.5 pg (ref 26.0–34.0)
MCHC: 29.1 g/dL — ABNORMAL LOW (ref 30.0–36.0)
MCV: 98.1 fL (ref 80.0–100.0)
Platelets: 253 10*3/uL (ref 150–400)
RBC: 2.7 MIL/uL — ABNORMAL LOW (ref 4.22–5.81)
RDW: 19.4 % — ABNORMAL HIGH (ref 11.5–15.5)
WBC: 15.4 10*3/uL — ABNORMAL HIGH (ref 4.0–10.5)
nRBC: 0.1 % (ref 0.0–0.2)

## 2021-06-15 LAB — VALPROIC ACID LEVEL: Valproic Acid Lvl: <10 ug/mL — ABNORMAL LOW (ref 50.0–100.0)

## 2021-06-15 LAB — BASIC METABOLIC PANEL
Anion gap: 9 (ref 5–15)
BUN: 31 mg/dL — ABNORMAL HIGH (ref 6–20)
CO2: 35 mmol/L — ABNORMAL HIGH (ref 22–32)
Calcium: 8.9 mg/dL (ref 8.9–10.3)
Chloride: 98 mmol/L (ref 98–111)
Creatinine, Ser: 0.43 mg/dL — ABNORMAL LOW (ref 0.61–1.24)
GFR, Estimated: >60 mL/min (ref >60)
Glucose, Bld: 117 mg/dL — ABNORMAL HIGH (ref 70–99)
Potassium: 4 mmol/L (ref 3.5–5.1)
Sodium: 142 mmol/L (ref 135–145)

## 2021-06-15 MED ORDER — LORAZEPAM 2 MG/ML IJ SOLN
2.0000 mg | INTRAMUSCULAR | Status: DC | PRN
  Administered 2021-06-15: 2 mg via INTRAVENOUS
  Filled 2021-06-15: qty 1

## 2021-06-15 MED ORDER — LORAZEPAM 2 MG/ML IJ SOLN: 2.0000 mg | INTRAMUSCULAR | Status: DC | PRN

## 2021-06-15 MED ORDER — DIAZEPAM 5 MG PO TABS
10.0000 mg | ORAL_TABLET | Freq: Four times a day (QID) | ORAL | Status: DC
  Administered 2021-06-15 (×2): 10 mg
  Filled 2021-06-15 (×2): qty 2

## 2021-06-15 MED ORDER — GLYCOPYRROLATE 0.2 MG/ML IJ SOLN
0.4000 mg | INTRAMUSCULAR | Status: DC
Start: 2021-06-15 — End: 2021-06-16
  Administered 2021-06-15: 0.4 mg via INTRAVENOUS
  Filled 2021-06-15: qty 2

## 2021-06-22 LAB — FUNGUS CULTURE WITH STAIN

## 2021-06-22 LAB — FUNGUS CULTURE RESULT

## 2021-06-22 LAB — FUNGAL ORGANISM REFLEX

## 2021-07-02 NOTE — ED Notes (Signed)
Sgt W[employer]ell [employer] [phone] and Donata Duff ME notified of TOD 2035

## 2021-07-02 NOTE — Procedures (Signed)
Extubation Procedure Note  Patient Details:   Name: Nathan West DOB: 09/22/1990 MRN: [id number]   Airway Documentation:    Vent end date: 06/14/2021 Vent end time: 1849   Pt extubated to RA per comfort care orders.  Guss Bunde 06/10/2021, 6:49 PM
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
## 2021-07-02 NOTE — Progress Notes (Signed)
50 mL dilaudid wasted with Gladys Damme, RN

## 2021-07-02 NOTE — Plan of Care (Signed)
  Problem: Nutrition: Goal: Adequate nutrition will be maintained Outcome: Progressing   Problem: Elimination: Goal: Will not experience complications related to urinary retention Outcome: Progressing   Problem: Skin Integrity: Goal: Risk for impaired skin integrity will decrease Outcome: Not Progressing

## 2021-07-02 NOTE — Progress Notes (Signed)
Dr. Fredricka Bonine notified of TOD

## 2021-07-02 NOTE — Progress Notes (Signed)
Pt's mother Onofre Gains [phone] contacted and notified of pt time of death. She states she will call tomorrow for [employer] contact information.
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
## 2021-07-02 DEATH — deceased
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
